# Patient Record
Sex: Female | Born: 1965
Health system: Southern US, Community
[De-identification: ages and names within clinical notes are randomized; demographics above are authoritative.]

## PROBLEM LIST (undated history)

## (undated) DIAGNOSIS — D219 Benign neoplasm of connective and other soft tissue, unspecified: Secondary | ICD-10-CM

## (undated) DIAGNOSIS — R519 Headache, unspecified: Secondary | ICD-10-CM

## (undated) DIAGNOSIS — R51 Headache: Secondary | ICD-10-CM

## (undated) DIAGNOSIS — I10 Essential (primary) hypertension: Secondary | ICD-10-CM

## (undated) DIAGNOSIS — E559 Vitamin D deficiency, unspecified: Secondary | ICD-10-CM

## (undated) DIAGNOSIS — D649 Anemia, unspecified: Secondary | ICD-10-CM

## (undated) HISTORY — PX: UTERINE FIBROID EMBOLIZATION: SHX825

## (undated) HISTORY — PX: BREAST SURGERY: SHX581

---

## 2012-07-30 ENCOUNTER — Encounter (HOSPITAL_COMMUNITY): Payer: Self-pay | Admitting: Emergency Medicine

## 2012-07-30 ENCOUNTER — Emergency Department (HOSPITAL_COMMUNITY)
Admission: EM | Admit: 2012-07-30 | Discharge: 2012-07-30 | Disposition: A | Discharge status: Home / Self Care | Payer: No Typology Code available for payment source | Attending: Emergency Medicine | Admitting: Emergency Medicine

## 2012-07-30 ENCOUNTER — Emergency Department (HOSPITAL_COMMUNITY): Payer: No Typology Code available for payment source

## 2012-07-30 DIAGNOSIS — N939 Abnormal uterine and vaginal bleeding, unspecified: Secondary | ICD-10-CM

## 2012-07-30 DIAGNOSIS — D649 Anemia, unspecified: Secondary | ICD-10-CM

## 2012-07-30 DIAGNOSIS — N898 Other specified noninflammatory disorders of vagina: Secondary | ICD-10-CM | POA: Insufficient documentation

## 2012-07-30 DIAGNOSIS — Z79899 Other long term (current) drug therapy: Secondary | ICD-10-CM | POA: Insufficient documentation

## 2012-07-30 DIAGNOSIS — Z3202 Encounter for pregnancy test, result negative: Secondary | ICD-10-CM | POA: Insufficient documentation

## 2012-07-30 DIAGNOSIS — D259 Leiomyoma of uterus, unspecified: Secondary | ICD-10-CM

## 2012-07-30 DIAGNOSIS — R109 Unspecified abdominal pain: Secondary | ICD-10-CM | POA: Insufficient documentation

## 2012-07-30 LAB — URINALYSIS, ROUTINE W REFLEX MICROSCOPIC
Bilirubin Urine: NEGATIVE
Glucose, UA: NEGATIVE mg/dL
Ketones, ur: NEGATIVE mg/dL
Leukocytes, UA: NEGATIVE
Nitrite: NEGATIVE
Protein, ur: NEGATIVE mg/dL
Specific Gravity, Urine: 1.012 (ref 1.005–1.030)
Urobilinogen, UA: 0.2 mg/dL (ref 0.0–1.0)
pH: 5.5 (ref 5.0–8.0)

## 2012-07-30 LAB — CBC WITH DIFFERENTIAL/PLATELET
Basophils Relative: 0 % (ref 0–1)
Eosinophils Absolute: 0.2 10*3/uL (ref 0.0–0.7)
Eosinophils Relative: 3 % (ref 0–5)
Hemoglobin: 7.9 g/dL — ABNORMAL LOW (ref 12.0–15.0)
Lymphs Abs: 1.7 10*3/uL (ref 0.7–4.0)
MCH: 21.8 pg — ABNORMAL LOW (ref 26.0–34.0)
MCHC: 29.6 g/dL — ABNORMAL LOW (ref 30.0–36.0)
MCV: 73.8 fL — ABNORMAL LOW (ref 78.0–100.0)
Monocytes Absolute: 0.3 10*3/uL (ref 0.1–1.0)
Platelets: 213 10*3/uL (ref 150–400)
RBC: 3.62 MIL/uL — ABNORMAL LOW (ref 3.87–5.11)

## 2012-07-30 LAB — URINE MICROSCOPIC-ADD ON

## 2012-07-30 LAB — BASIC METABOLIC PANEL
BUN: 6 mg/dL (ref 6–23)
CO2: 26 mEq/L (ref 19–32)
Chloride: 104 mEq/L (ref 96–112)
Creatinine, Ser: 0.58 mg/dL (ref 0.50–1.10)

## 2012-07-30 LAB — WET PREP, GENITAL

## 2012-07-30 MED ORDER — FERROUS SULFATE 325 (65 FE) MG PO TABS
325.0000 mg | ORAL_TABLET | Freq: Once | ORAL | Status: AC
  Administered 2012-07-30: 325 mg via ORAL
  Filled 2012-07-30: qty 1

## 2012-07-30 MED ORDER — TRAMADOL HCL 50 MG PO TABS: 50.0000 mg | ORAL_TABLET | Freq: Four times a day (QID) | ORAL | Status: DC | PRN

## 2012-07-30 MED ORDER — FERROUS SULFATE 325 (65 FE) MG PO TABS: 325.0000 mg | ORAL_TABLET | Freq: Every day | ORAL | Status: AC

## 2012-07-30 NOTE — ED Notes (Signed)
Pt reports vaginal bleeding x 2 weeks now.  Normally only has her period x 1 week.. Pt also reports low abd discomfort.  Pt reports the discomfort gets better after having a BM.  Pt reports BM is not regular, LBM-x 2 days ago.  Pt reports right now bleeding is very mild, is spotting like she is getting ready to start her period again.

## 2012-07-30 NOTE — ED Provider Notes (Signed)
History    CSN: [id number] Arrival date & time 07/30/12  1126  First MD Initiated Contact with Patient 07/30/12 1210     Chief Complaint  Patient presents with  . Abdominal Pain  . Vaginal Bleeding  . Cyst   (Consider location/radiation/quality/duration/timing/severity/associated sxs/prior Treatment) HPI Comments: Patient is a 47 year old female who presents with a 2 week history of abdominal pain. The pain is located in her lower abdomen and does not radiate. The pain is described as cramping and moderate. The pain started gradually and progressively worsened since the onset. No alleviating/aggravating factors. The patient has tried nothing for symptoms without relief. Associated symptoms include vaginal bleeding for 2 weeks. Patient was supposed to stop her menstrual cycle 1 week ago but has continued to bleed. She reports changing her pad every 2 hours. Patient denies fever, headache, NVD, chest pain, SOB, dysuria, constipation.     History reviewed. No pertinent past medical history. History reviewed. No pertinent past surgical history. No family history on file. History  Substance Use Topics  . Smoking status: Not on file  . Smokeless tobacco: Not on file  . Alcohol Use: Not on file   OB History   Grav Para Term Preterm Abortions TAB SAB Ect Mult Living                 Review of Systems  Gastrointestinal: Positive for abdominal pain.  Genitourinary: Positive for vaginal bleeding and menstrual problem.  All other systems reviewed and are negative.    Allergies  Review of patient's allergies indicates no known allergies.  Home Medications   Current Outpatient Rx  Name  Route  Sig  Dispense  Refill  . Aspirin-Salicylamide-Caffeine (BC HEADACHE POWDER PO)   Oral   Take 1 Package by mouth every 6 (six) hours as needed (pain).         . ferrous sulfate 325 (65 FE) MG EC tablet   Oral   Take 325 mg by mouth 2 (two) times daily with breakfast and lunch.           BP 142/100  Pulse 72  Temp(Src) 97.6 F (36.4 C) (Oral)  Resp 20  SpO2 100% Physical Exam  Nursing note and vitals reviewed. Constitutional: She is oriented to person, place, and time. She appears well-developed and well-nourished. No distress.  HENT:  Head: Normocephalic and atraumatic.  Eyes: Conjunctivae are normal.  Neck: Normal range of motion.  Cardiovascular: Normal rate and regular rhythm.  Exam reveals no gallop and no friction rub.   No murmur heard. Pulmonary/Chest: Effort normal and breath sounds normal. She has no wheezes. She has no rales. She exhibits no tenderness.  Abdominal: Soft. She exhibits no distension. There is tenderness. There is no rebound and no guarding.  Mild suprapubic abdominal tenderness to palpation. No peritoneal signs or focal tenderness to palpation.   Genitourinary:  External genitalia normal. Moderate amount of blood noted in vagina coming from cervical os. No CMT or discharge noted. Patient reports discomfort with palpation of uterus on bimanual exam. No abnormal masses palpated.   Musculoskeletal: Normal range of motion.  Neurological: She is alert and oriented to person, place, and time. Coordination normal.  Speech is goal-oriented. Moves limbs without ataxia.   Skin: Skin is warm and dry.  Psychiatric: She has a normal mood and affect. Her behavior is normal.    ED Course  Procedures (including critical care time) Labs Reviewed  WET PREP, GENITAL - Abnormal; Notable for  the following:    Clue Cells Wet Prep HPF POC MODERATE (*)    WBC, Wet Prep HPF POC MODERATE (*)    All other components within normal limits  URINALYSIS, ROUTINE W REFLEX MICROSCOPIC - Abnormal; Notable for the following:    APPearance CLOUDY (*)    Hgb urine dipstick MODERATE (*)    All other components within normal limits  CBC WITH DIFFERENTIAL - Abnormal; Notable for the following:    RBC 3.62 (*)    Hemoglobin 7.9 (*)    HCT 26.7 (*)    MCV 73.8 (*)     MCH 21.8 (*)    MCHC 29.6 (*)    RDW 17.5 (*)    All other components within normal limits  GC/CHLAMYDIA PROBE AMP  BASIC METABOLIC PANEL  PREGNANCY, URINE  URINE MICROSCOPIC-ADD ON   US Transvaginal Non-ob  07/30/2012   *RADIOLOGY REPORT*  Clinical Data: Abdominal pain and vaginal bleeding.  Evaluate for cyst.  LMP 07/15/2012  TRANSABDOMINAL AND TRANSVAGINAL ULTRASOUND OF PELVIS Technique:  Both transabdominal and transvaginal ultrasound examinations of the pelvis were performed. Transabdominal technique was performed for global imaging of the pelvis including uterus, ovaries, adnexal regions, and pelvic cul-de-sac.  It was necessary to proceed with endovaginal exam following the transabdominal exam to visualize the myometrium, endometrium and adnexa.  Comparison:  None  Findings:  Uterus: Is anteverted and anteflexed and demonstrates a sagittal length of 14.4 cm, depth of 9.0 cm and width of 10.6 cm.  A focal fibroid is identified in the anterior fundal region measuring 6.5 x 5.7 x 7.4 cm and a smaller fibroid is seen in the anterior lower uterine segment measuring 3.5 x 3.1 x 2.7 cm.  Endometrium: Cannot be visualized due to the presence of the fibroid load.  The central positioning of the largest fibroid makes a partial submucosal component possible.  Right ovary:  Is only seen transabdominally and has a normal transabdominal appearance measuring 2.6 x 1.6 x 2.5 cm  Left ovary: Has a normal appearance measuring 3.5 x 2.7 x 3.6 cm containing several follicles  Other findings: No pelvic fluid or separate adnexal masses are seen.  IMPRESSION: Enlarged uterus with fibroids as noted above.  Central positioning of the largest fibroid makes a partial submucosal component possible.  The endometrial lining is incompletely evaluated due to the fibroid load. If further assessment of the endometrial fibroid relationship is desired, sonohysterography or pelvic MRI can be performed.  Normal ovaries.   Original  Report Authenticated By: Rhodia Albright, M.D.   US Pelvis Complete  07/30/2012   *RADIOLOGY REPORT*  Clinical Data: Abdominal pain and vaginal bleeding.  Evaluate for cyst.  LMP 07/15/2012  TRANSABDOMINAL AND TRANSVAGINAL ULTRASOUND OF PELVIS Technique:  Both transabdominal and transvaginal ultrasound examinations of the pelvis were performed. Transabdominal technique was performed for global imaging of the pelvis including uterus, ovaries, adnexal regions, and pelvic cul-de-sac.  It was necessary to proceed with endovaginal exam following the transabdominal exam to visualize the myometrium, endometrium and adnexa.  Comparison:  None  Findings:  Uterus: Is anteverted and anteflexed and demonstrates a sagittal length of 14.4 cm, depth of 9.0 cm and width of 10.6 cm.  A focal fibroid is identified in the anterior fundal region measuring 6.5 x 5.7 x 7.4 cm and a smaller fibroid is seen in the anterior lower uterine segment measuring 3.5 x 3.1 x 2.7 cm.  Endometrium: Cannot be visualized due to the presence of the fibroid load.  The  central positioning of the largest fibroid makes a partial submucosal component possible.  Right ovary:  Is only seen transabdominally and has a normal transabdominal appearance measuring 2.6 x 1.6 x 2.5 cm  Left ovary: Has a normal appearance measuring 3.5 x 2.7 x 3.6 cm containing several follicles  Other findings: No pelvic fluid or separate adnexal masses are seen.  IMPRESSION: Enlarged uterus with fibroids as noted above.  Central positioning of the largest fibroid makes a partial submucosal component possible.  The endometrial lining is incompletely evaluated due to the fibroid load. If further assessment of the endometrial fibroid relationship is desired, sonohysterography or pelvic MRI can be performed.  Normal ovaries.   Original Report Authenticated By: Rhodia Albright, M.D.   1. Uterine fibroid   2. Vaginal bleeding   3. Anemia     MDM  12:41 PM Labs and urinalysis  pending. Pelvic exam done. Patient will have pelvic US. Vitals stable and patient afebrile.   2:41 PM Labs show low hemoglobin. Patient will have iron supplement. Patient's US shows uterine fibroids likely causing the patient's bleeding. Patient will be discharged with iron supplements and instructed to follow up with her OBGYN in [place]. Vitals stable and patient afebrile. Patient denies SOB, lightheadedness. Patient instructed to return with worsening or concerning symptoms.   Emilia Beck, PA-C 07/30/12 1448

## 2012-07-30 NOTE — ED Notes (Signed)
Pt ambulated to the BR with steady gait.   

## 2012-07-30 NOTE — ED Notes (Signed)
Pt states that she has been on her cycle for 2 weeks now heavy, states that she has lower abd pain and cramping, normally her cycle is on for 7 days, she feels a knot to rt lower abd pain .

## 2012-07-31 LAB — GC/CHLAMYDIA PROBE AMP
CT Probe RNA: NEGATIVE
GC Probe RNA: NEGATIVE

## 2012-08-07 NOTE — ED Provider Notes (Signed)
Medical screening examination/treatment/procedure(s) were performed by non-physician practitioner and as supervising physician I was immediately available for consultation/collaboration.  Tashica Provencio T Della Homan, MD 08/07/12 0527 

## 2012-08-14 ENCOUNTER — Encounter (HOSPITAL_COMMUNITY): Payer: Self-pay | Admitting: Emergency Medicine

## 2012-08-14 ENCOUNTER — Emergency Department (HOSPITAL_COMMUNITY)
Admission: EM | Admit: 2012-08-14 | Discharge: 2012-08-14 | Discharge status: Against Medical Advice | Payer: No Typology Code available for payment source | Attending: Emergency Medicine | Admitting: Emergency Medicine

## 2012-08-14 DIAGNOSIS — Z87891 Personal history of nicotine dependence: Secondary | ICD-10-CM | POA: Insufficient documentation

## 2012-08-14 DIAGNOSIS — N63 Unspecified lump in unspecified breast: Secondary | ICD-10-CM | POA: Insufficient documentation

## 2012-08-14 DIAGNOSIS — D259 Leiomyoma of uterus, unspecified: Secondary | ICD-10-CM | POA: Insufficient documentation

## 2012-08-14 NOTE — ED Notes (Addendum)
Pt reports having uterine fibroids, that she has obtained treatment before, however pain since diagnosed has continued and increased. Pt also c/o a tender area in her breast that she is concerned about. Pt states she has been having hot flashes over the past week. Pt states her period came on 07/17/2012 and went off for one week during the week of 08/08/2012, however restarted yesterday.  Pt is A/O x4, NAD.

## 2012-11-02 ENCOUNTER — Emergency Department (HOSPITAL_BASED_OUTPATIENT_CLINIC_OR_DEPARTMENT_OTHER)
Admission: EM | Admit: 2012-11-02 | Discharge: 2012-11-02 | Disposition: A | Discharge status: Home / Self Care | Payer: Medicaid Other | Attending: Emergency Medicine | Admitting: Emergency Medicine

## 2012-11-02 ENCOUNTER — Encounter (HOSPITAL_BASED_OUTPATIENT_CLINIC_OR_DEPARTMENT_OTHER): Payer: Self-pay | Admitting: Emergency Medicine

## 2012-11-02 DIAGNOSIS — R079 Chest pain, unspecified: Secondary | ICD-10-CM | POA: Insufficient documentation

## 2012-11-02 DIAGNOSIS — T887XXA Unspecified adverse effect of drug or medicament, initial encounter: Secondary | ICD-10-CM

## 2012-11-02 DIAGNOSIS — R42 Dizziness and giddiness: Secondary | ICD-10-CM | POA: Insufficient documentation

## 2012-11-02 DIAGNOSIS — R61 Generalized hyperhidrosis: Secondary | ICD-10-CM | POA: Insufficient documentation

## 2012-11-02 DIAGNOSIS — Z79899 Other long term (current) drug therapy: Secondary | ICD-10-CM | POA: Insufficient documentation

## 2012-11-02 DIAGNOSIS — T454X5A Adverse effect of iron and its compounds, initial encounter: Secondary | ICD-10-CM | POA: Insufficient documentation

## 2012-11-02 DIAGNOSIS — D649 Anemia, unspecified: Secondary | ICD-10-CM | POA: Insufficient documentation

## 2012-11-02 DIAGNOSIS — Z87891 Personal history of nicotine dependence: Secondary | ICD-10-CM | POA: Insufficient documentation

## 2012-11-02 DIAGNOSIS — R0602 Shortness of breath: Secondary | ICD-10-CM | POA: Insufficient documentation

## 2012-11-02 HISTORY — DX: Anemia, unspecified: D64.9

## 2012-11-02 LAB — CBC WITH DIFFERENTIAL/PLATELET
Basophils Absolute: 0 10*3/uL (ref 0.0–0.1)
HCT: 23.6 % — ABNORMAL LOW (ref 36.0–46.0)
Lymphs Abs: 2.1 10*3/uL (ref 0.7–4.0)
MCH: 21.2 pg — ABNORMAL LOW (ref 26.0–34.0)
MCHC: 28.8 g/dL — ABNORMAL LOW (ref 30.0–36.0)
MCV: 73.5 fL — ABNORMAL LOW (ref 78.0–100.0)
Monocytes Relative: 10 % (ref 3–12)
Neutro Abs: 6.4 10*3/uL (ref 1.7–7.7)
RDW: 18.6 % — ABNORMAL HIGH (ref 11.5–15.5)

## 2012-11-02 LAB — COMPREHENSIVE METABOLIC PANEL
Alkaline Phosphatase: 42 U/L (ref 39–117)
BUN: 9 mg/dL (ref 6–23)
GFR calc Af Amer: >90 mL/min (ref >90)
Glucose, Bld: 113 mg/dL — ABNORMAL HIGH (ref 70–99)
Potassium: 3.3 mEq/L — ABNORMAL LOW (ref 3.5–5.1)
Total Bilirubin: <0.1 mg/dL — ABNORMAL LOW (ref 0.3–1.2)
Total Protein: 6.7 g/dL (ref 6.0–8.3)

## 2012-11-02 LAB — TROPONIN I: Troponin I: <0.3 ng/mL (ref <0.30)

## 2012-11-02 NOTE — ED Notes (Signed)
Pt having left sided chest pain.  Pt states it started this am, with some sob, dizziness, lightheadedness and diaphoresis.  Pt states she received iron infusion yesterday for hemoglobin of 7.

## 2012-11-02 NOTE — ED Provider Notes (Signed)
CSN: [id number]     Arrival date & time 11/02/12  4098 History   First MD Initiated Contact with Patient 11/02/12 [phone]     Chief Complaint  Patient presents with  . Chest Pain    HPI Pt having left sided chest pain. Pt states it started this am, with some sob, dizziness, lightheadedness and diaphoresis. Pt states she received iron infusion yesterday for hemoglobin of 7.   Past Medical History  Diagnosis Date  . Anemia    Past Surgical History  Procedure Laterality Date  . Cesarean section     History reviewed. No pertinent family history. History  Substance Use Topics  . Smoking status: Former Smoker -- 0.20 packs/day for 2 years    Types: Cigars    Quit date: 04/12/2012  . Smokeless tobacco: Never Used  . Alcohol Use: No   OB History   Grav Para Term Preterm Abortions TAB SAB Ect Mult Living                 Review of Systems All other systems reviewed and are negative Allergies  Review of patient's allergies indicates no known allergies.  Home Medications   Current Outpatient Rx  Name  Route  Sig  Dispense  Refill  . ferrous sulfate 325 (65 FE) MG tablet   Oral   Take 1 tablet (325 mg total) by mouth daily.   30 tablet   0    BP 150/80  Pulse 62  Temp(Src) 98.4 F (36.9 C) (Oral)  Resp 24  Ht 5\' 6"  (1.676 m)  Wt 150 lb (68.04 kg)  BMI 24.22 kg/m2  SpO2 100%  LMP 10/19/2012 Physical Exam  Nursing note and vitals reviewed. Constitutional: She is oriented to person, place, and time. She appears well-developed and well-nourished. No distress.  HENT:  Head: Normocephalic and atraumatic.  Eyes: Pupils are equal, round, and reactive to light.  Neck: Normal range of motion.  Cardiovascular: Normal rate and intact distal pulses.   Pulmonary/Chest: No respiratory distress.  Abdominal: Normal appearance. She exhibits no distension.  Musculoskeletal: Normal range of motion.  Neurological: She is alert and oriented to person, place, and time. No cranial nerve  deficit.  Skin: Skin is warm and dry. No rash noted.  Psychiatric: She has a normal mood and affect. Her behavior is normal.    ED Course  Procedures (including critical care time) Labs Review Labs Reviewed  CBC WITH DIFFERENTIAL - Abnormal; Notable for the following:    RBC 3.21 (*)    Hemoglobin 6.8 (*)    HCT 23.6 (*)    MCV 73.5 (*)    MCH 21.2 (*)    MCHC 28.8 (*)    RDW 18.6 (*)    All other components within normal limits  COMPREHENSIVE METABOLIC PANEL - Abnormal; Notable for the following:    Potassium 3.3 (*)    Glucose, Bld 113 (*)    Albumin 3.4 (*)    Total Bilirubin <0.1 (*)    All other components within normal limits  TROPONIN I  TROPONIN I   Imaging Review No results found.  EKG Interpretation     Ventricular Rate:  63 PR Interval:  158 QRS Duration: 84 QT Interval:  408 QTC Calculation: 417 R Axis:   23 Text Interpretation:  Normal sinus rhythm Normal ECG           Review of iron infusion reveals chest pain is a common side effect.  A lot of normal  EKG and serial troponins which are negative most likely at etiology of patient's discomfort.  Hemoglobin appears to be stable.  Patient discharged home with followup instructions. MDM   1. Non-dose-related adverse reaction to medication, initial encounter        Nelia Shi, MD 11/02/12 1252

## 2012-11-13 ENCOUNTER — Emergency Department (HOSPITAL_BASED_OUTPATIENT_CLINIC_OR_DEPARTMENT_OTHER)
Admission: EM | Admit: 2012-11-13 | Discharge: 2012-11-13 | Discharge status: Against Medical Advice | Payer: Medicaid Other | Attending: Emergency Medicine | Admitting: Emergency Medicine

## 2012-11-13 ENCOUNTER — Encounter (HOSPITAL_BASED_OUTPATIENT_CLINIC_OR_DEPARTMENT_OTHER): Payer: Self-pay | Admitting: Emergency Medicine

## 2012-11-13 DIAGNOSIS — D649 Anemia, unspecified: Secondary | ICD-10-CM | POA: Insufficient documentation

## 2012-11-13 DIAGNOSIS — Z87891 Personal history of nicotine dependence: Secondary | ICD-10-CM | POA: Insufficient documentation

## 2012-11-13 DIAGNOSIS — R079 Chest pain, unspecified: Secondary | ICD-10-CM | POA: Insufficient documentation

## 2012-11-13 DIAGNOSIS — N390 Urinary tract infection, site not specified: Secondary | ICD-10-CM | POA: Insufficient documentation

## 2012-11-13 DIAGNOSIS — Z79899 Other long term (current) drug therapy: Secondary | ICD-10-CM | POA: Insufficient documentation

## 2012-11-13 LAB — URINALYSIS, ROUTINE W REFLEX MICROSCOPIC
Hgb urine dipstick: NEGATIVE
Ketones, ur: 15 mg/dL — AB
Nitrite: POSITIVE — AB
Specific Gravity, Urine: 1.018 (ref 1.005–1.030)
Urobilinogen, UA: >8 mg/dL — ABNORMAL HIGH (ref 0.0–1.0)
pH: 7 (ref 5.0–8.0)

## 2012-11-13 LAB — URINE MICROSCOPIC-ADD ON

## 2012-11-13 LAB — TROPONIN I: Troponin I: <0.3 ng/mL (ref <0.30)

## 2012-11-13 MED ORDER — SULFAMETHOXAZOLE-TRIMETHOPRIM 800-160 MG PO TABS: 1.0000 | ORAL_TABLET | Freq: Two times a day (BID) | ORAL | Status: DC

## 2012-11-13 MED ORDER — SULFAMETHOXAZOLE-TMP DS 800-160 MG PO TABS
1.0000 | ORAL_TABLET | Freq: Once | ORAL | Status: AC
  Administered 2012-11-13: 1 via ORAL
  Filled 2012-11-13: qty 1

## 2012-11-13 NOTE — ED Notes (Signed)
Patient changed into gown, MD at bedside.

## 2012-11-13 NOTE — ED Notes (Signed)
C/o CP and SOB x 2 weeks-states same c/o after having iron infusion 2 weeks ago-states she was seen for same and advised is normal side effect-concerned that s/s have not gone away

## 2012-11-13 NOTE — ED Notes (Signed)
MD at bedside giving test results and dispo plan of care. 

## 2012-11-13 NOTE — ED Provider Notes (Signed)
CSN: [id number]     Arrival date & time 11/13/12  1357 History   First MD Initiated Contact with Patient 11/13/12 1408     Chief Complaint  Patient presents with  . Chest Pain   (Consider location/radiation/quality/duration/timing/severity/associated sxs/prior Treatment) Patient is a 47 y.o. female presenting with chest pain. The history is provided by the patient.  Chest Pain Pain location:  R chest and L chest Pain quality: pressure   Pain radiates to:  Does not radiate Pain radiates to the back: no   Pain severity:  Mild Onset quality:  Sudden Timing:  Sporadic Progression:  Improving Chronicity:  New Context: not eating   Relieved by:  Nothing Worsened by:  Nothing tried Ineffective treatments:  None tried Associated symptoms: no abdominal pain, no cough, no fatigue, no fever, no shortness of breath and not vomiting     Past Medical History  Diagnosis Date  . Anemia    Past Surgical History  Procedure Laterality Date  . Cesarean section     No family history on file. History  Substance Use Topics  . Smoking status: Former Smoker -- 0.20 packs/day for 2 years    Types: Cigars    Quit date: 04/12/2012  . Smokeless tobacco: Never Used  . Alcohol Use: No   OB History   Grav Para Term Preterm Abortions TAB SAB Ect Mult Living                 Review of Systems  Constitutional: Negative for fever, chills and fatigue.  Respiratory: Negative for cough and shortness of breath.   Cardiovascular: Positive for chest pain. Negative for leg swelling.  Gastrointestinal: Negative for vomiting and abdominal pain.  All other systems reviewed and are negative.    Allergies  Review of patient's allergies indicates no known allergies.  Home Medications   Current Outpatient Rx  Name  Route  Sig  Dispense  Refill  . ferrous sulfate 325 (65 FE) MG tablet   Oral   Take 1 tablet (325 mg total) by mouth daily.   30 tablet   0   . sulfamethoxazole-trimethoprim (SEPTRA DS)  800-160 MG per tablet   Oral   Take 1 tablet by mouth 2 (two) times daily.   10 tablet   0    BP 134/83  Pulse 81  Temp(Src) 99.1 F (37.3 C) (Oral)  Resp 18  Ht 5\' 5"  (1.651 m)  Wt 145 lb (65.772 kg)  BMI 24.13 kg/m2  SpO2 100%  LMP 10/19/2012 Physical Exam  Nursing note and vitals reviewed. Constitutional: She is oriented to person, place, and time. She appears well-developed and well-nourished. No distress.  HENT:  Head: Normocephalic and atraumatic.  Eyes: EOM are normal. Pupils are equal, round, and reactive to light.  Neck: Normal range of motion. Neck supple.  Cardiovascular: Normal rate and regular rhythm.  Exam reveals no friction rub.   No murmur heard. Pulmonary/Chest: Effort normal and breath sounds normal. No respiratory distress. She has no wheezes. She has no rales. She exhibits no tenderness.  Abdominal: Soft. She exhibits no distension. There is no tenderness. There is no rebound.  Musculoskeletal: Normal range of motion. She exhibits no edema.  Neurological: She is alert and oriented to person, place, and time.  Skin: She is not diaphoretic.    ED Course  Procedures (including critical care time) Labs Review Labs Reviewed  URINALYSIS, ROUTINE W REFLEX MICROSCOPIC - Abnormal; Notable for the following:    Color, Urine  RED (*)    APPearance CLOUDY (*)    Bilirubin Urine SMALL (*)    Ketones, ur 15 (*)    Protein, ur 30 (*)    Urobilinogen, UA >8.0 (*)    Nitrite POSITIVE (*)    Leukocytes, UA LARGE (*)    All other components within normal limits  URINE MICROSCOPIC-ADD ON - Abnormal; Notable for the following:    Bacteria, UA MANY (*)    All other components within normal limits  URINE CULTURE  TROPONIN I   Imaging Review No results found.  EKG Interpretation     Ventricular Rate:  79 PR Interval:  146 QRS Duration: 74 QT Interval:  370 QTC Calculation: 424 R Axis:   16 Text Interpretation:  Normal sinus rhythm Nonspecific ST  abnormality            MDM   1. Chest pain   2. UTI (urinary tract infection)    45F here with chest pain. It has been going on for the past 2 weeks. She's had a recent iron infusion and the day after having chest pain. She was seen in the ED that day had serial troponins that were negative and a normal EKG. Minor infusion causes chest pain as part of the side effects. She's had continuing chest pain, however is improving, however is still happening usually once a day or once every other day. She denies any fevers, shortness of breath, vomiting, diarrhea, nausea here. She states the pain is like a pressure that she cannot find any alleviating or exacerbating factors or. No instigating factors. Here EKG is normal. We will check serial troponins. We'll also check a urinalysis as she states some pressure on her bladder, however denies dysuria. PERC negative. UA shows UTI. Bactrim given PO. She wants to sign out AMA prior to her 2nd troponin returning. I think this is reasonable, however I do want to pursue serial troponins due to intermittent nature of CP. Patient given Bactrim for UTI.  Dagmar Hait, MD 11/13/12 1536

## 2012-11-14 LAB — URINE CULTURE

## 2012-11-15 NOTE — ED Notes (Signed)
+   Urine Patient treated with Sulfa-trim.sensitive to same per protocol MD.

## 2013-01-16 HISTORY — PX: DILATION AND CURETTAGE OF UTERUS: SHX78

## 2013-04-24 HISTORY — PX: ABLATION: SHX5711

## 2013-06-18 ENCOUNTER — Emergency Department (HOSPITAL_BASED_OUTPATIENT_CLINIC_OR_DEPARTMENT_OTHER)
Admission: EM | Admit: 2013-06-18 | Discharge: 2013-06-18 | Disposition: A | Discharge status: Home / Self Care | Payer: Medicaid Other | Attending: Emergency Medicine | Admitting: Emergency Medicine

## 2013-06-18 ENCOUNTER — Emergency Department (HOSPITAL_BASED_OUTPATIENT_CLINIC_OR_DEPARTMENT_OTHER): Payer: Medicaid Other

## 2013-06-18 ENCOUNTER — Encounter (HOSPITAL_BASED_OUTPATIENT_CLINIC_OR_DEPARTMENT_OTHER): Payer: Self-pay | Admitting: Emergency Medicine

## 2013-06-18 DIAGNOSIS — D251 Intramural leiomyoma of uterus: Secondary | ICD-10-CM | POA: Insufficient documentation

## 2013-06-18 DIAGNOSIS — D649 Anemia, unspecified: Secondary | ICD-10-CM | POA: Insufficient documentation

## 2013-06-18 DIAGNOSIS — Z79899 Other long term (current) drug therapy: Secondary | ICD-10-CM | POA: Insufficient documentation

## 2013-06-18 DIAGNOSIS — R102 Pelvic and perineal pain: Secondary | ICD-10-CM

## 2013-06-18 DIAGNOSIS — Z87891 Personal history of nicotine dependence: Secondary | ICD-10-CM | POA: Insufficient documentation

## 2013-06-18 DIAGNOSIS — D219 Benign neoplasm of connective and other soft tissue, unspecified: Secondary | ICD-10-CM

## 2013-06-18 DIAGNOSIS — Z9889 Other specified postprocedural states: Secondary | ICD-10-CM | POA: Insufficient documentation

## 2013-06-18 DIAGNOSIS — Z792 Long term (current) use of antibiotics: Secondary | ICD-10-CM | POA: Insufficient documentation

## 2013-06-18 DIAGNOSIS — Z3202 Encounter for pregnancy test, result negative: Secondary | ICD-10-CM | POA: Insufficient documentation

## 2013-06-18 DIAGNOSIS — N898 Other specified noninflammatory disorders of vagina: Secondary | ICD-10-CM

## 2013-06-18 DIAGNOSIS — Z791 Long term (current) use of non-steroidal anti-inflammatories (NSAID): Secondary | ICD-10-CM | POA: Insufficient documentation

## 2013-06-18 HISTORY — DX: Benign neoplasm of connective and other soft tissue, unspecified: D21.9

## 2013-06-18 LAB — URINALYSIS, ROUTINE W REFLEX MICROSCOPIC
Bilirubin Urine: NEGATIVE
Glucose, UA: NEGATIVE mg/dL
Ketones, ur: NEGATIVE mg/dL
Nitrite: NEGATIVE
PH: 5.5 (ref 5.0–8.0)
Protein, ur: NEGATIVE mg/dL
SPECIFIC GRAVITY, URINE: 1.029 (ref 1.005–1.030)
Urobilinogen, UA: 1 mg/dL (ref 0.0–1.0)

## 2013-06-18 LAB — URINE MICROSCOPIC-ADD ON

## 2013-06-18 LAB — PREGNANCY, URINE: PREG TEST UR: NEGATIVE

## 2013-06-18 LAB — WET PREP, GENITAL
TRICH WET PREP: NONE SEEN
YEAST WET PREP: NONE SEEN

## 2013-06-18 LAB — GC/CHLAMYDIA PROBE AMP
CT Probe RNA: NEGATIVE
GC Probe RNA: NEGATIVE

## 2013-06-18 MED ORDER — LIDOCAINE HCL (PF) 1 % IJ SOLN
INTRAMUSCULAR | Status: AC
  Administered 2013-06-18: 2 mL
  Filled 2013-06-18: qty 5

## 2013-06-18 MED ORDER — DOXYCYCLINE HYCLATE 100 MG PO CAPS: 100.0000 mg | ORAL_CAPSULE | Freq: Two times a day (BID) | ORAL | Status: DC

## 2013-06-18 MED ORDER — OXYCODONE-ACETAMINOPHEN 5-325 MG PO TABS: 1.0000 | ORAL_TABLET | Freq: Four times a day (QID) | ORAL | Status: DC | PRN

## 2013-06-18 MED ORDER — CEFTRIAXONE SODIUM 250 MG IJ SOLR
250.0000 mg | Freq: Once | INTRAMUSCULAR | Status: AC
  Administered 2013-06-18: 250 mg via INTRAMUSCULAR
  Filled 2013-06-18: qty 250

## 2013-06-18 MED ORDER — KETOROLAC TROMETHAMINE 60 MG/2ML IM SOLN
60.0000 mg | Freq: Once | INTRAMUSCULAR | Status: AC
  Administered 2013-06-18: 60 mg via INTRAMUSCULAR
  Filled 2013-06-18: qty 2

## 2013-06-18 MED ORDER — AZITHROMYCIN 1 G PO PACK
1.0000 g | PACK | Freq: Once | ORAL | Status: AC
  Administered 2013-06-18: 1 g via ORAL
  Filled 2013-06-18: qty 1

## 2013-06-18 NOTE — ED Provider Notes (Signed)
CSN: [id number]     Arrival date & time 06/18/13  0559 History   None    Chief Complaint  Patient presents with  . Abdominal Pain     (Consider location/radiation/quality/duration/timing/severity/associated sxs/prior Treatment) Patient is a 48 y.o. female presenting with abdominal pain. The history is provided by the patient.  Abdominal Pain Pain location:  Suprapubic Pain radiates to:  R flank Pain severity:  Severe Onset quality:  Gradual Timing:  Intermittent Progression:  Worsening Chronicity:  New Context: not trauma   Relieved by:  Nothing Worsened by:  Nothing tried Ineffective treatments:  None tried Associated symptoms: constipation, vaginal bleeding and vaginal discharge   Associated symptoms: no anorexia, no chills, no diarrhea, no dysuria, no fever, no hematuria, no nausea and no vomiting   Vaginal discharge:    Quality:  Yellow   Severity:  Moderate   Onset quality:  Gradual   Timing:  Constant   Progression:  Unchanged   Chronicity:  New Risk factors: not pregnant     Past Medical History  Diagnosis Date  . Anemia   . Fibroid    Past Surgical History  Procedure Laterality Date  . Cesarean section     History reviewed. No pertinent family history. History  Substance Use Topics  . Smoking status: Former Smoker -- 0.20 packs/day for 2 years    Types: Cigars    Quit date: 04/12/2012  . Smokeless tobacco: Never Used  . Alcohol Use: No   OB History   Grav Para Term Preterm Abortions TAB SAB Ect Mult Living                 Review of Systems  Constitutional: Negative for fever, chills and appetite change.  Gastrointestinal: Positive for abdominal pain and constipation. Negative for nausea, vomiting, diarrhea and anorexia.  Genitourinary: Positive for vaginal bleeding and vaginal discharge. Negative for dysuria and hematuria.  All other systems reviewed and are negative.     Allergies  Review of patient's allergies indicates no known  allergies.  Home Medications   Prior to Admission medications   Medication Sig Start Date End Date Taking? Authorizing Provider  ibuprofen (ADVIL,MOTRIN) 200 MG tablet Take 200 mg by mouth every 6 (six) hours as needed.   Yes Historical Provider, MD  ferrous sulfate 325 (65 FE) MG tablet Take 1 tablet (325 mg total) by mouth daily. 07/30/12   Kaitlyn Szekalski, PA-C  sulfamethoxazole-trimethoprim (SEPTRA DS) 800-160 MG per tablet Take 1 tablet by mouth 2 (two) times daily. 11/13/12   Osvaldo Shipper, MD   BP 164/96  Pulse 67  Temp(Src) 98.4 F (36.9 C) (Oral)  Resp 16  Ht 5\' 3"  (1.6 m)  Wt 140 lb (63.504 kg)  BMI 24.81 kg/m2  SpO2 100% Physical Exam  Constitutional: She is oriented to person, place, and time. She appears well-developed and well-nourished. No distress.  HENT:  Head: Normocephalic and atraumatic.  Mouth/Throat: Oropharynx is clear and moist.  Eyes: Conjunctivae are normal. Pupils are equal, round, and reactive to light.  Neck: Normal range of motion. Neck supple.  Cardiovascular: Normal rate, regular rhythm and intact distal pulses.   Pulmonary/Chest: Effort normal and breath sounds normal. She has no wheezes. She has no rales.  Abdominal: Soft. Bowel sounds are increased. There is tenderness. There is no rebound, no guarding, no tenderness at McBurney's point and negative Murphy's sign.    Tenderness is mild  Genitourinary: Vaginal discharge found.  Chaperone present.  Copious yellow-green discharge thin  consistency,  Enlarged lobular uterus present.  Mild adnexal tenderness on the righ  Musculoskeletal: Normal range of motion.  Neurological: She is alert and oriented to person, place, and time.  Skin: Skin is warm and dry.  Psychiatric: She has a normal mood and affect.    ED Course  Procedures (including critical care time) Labs Review Labs Reviewed  WET PREP, GENITAL  GC/CHLAMYDIA PROBE AMP  URINALYSIS, ROUTINE W REFLEX MICROSCOPIC  PREGNANCY,  URINE    Imaging Review No results found.   EKG Interpretation None      MDM   Final diagnoses:  None   Patient does not clinically have appendicitis and there are no signs of appendicitis on CT scan.    Discharge is consistent with pelvic infection.  Will treat for same.  Suspect pain is related to infection and fibroids.  Gave patient the option of waiting to discuss treatment with her regular doctor.  Patient is concerned about the ongoing discharge and using pads daily and elects to be treated at this time. This is reasonable given clinical exam.  Will treat with Rocephin, azithromycin and doxycycline as an outpatient along with a course of pain medication.   Cultures for GC and chlamydia were sent.  Discussed with patient need for outpatient ultrasound to better characterize fibroids.  Patient states she will follow up with her gynecologist for ongoing care and evaluation.      Carlisle Beers, MD 06/18/13 [phone]

## 2013-06-18 NOTE — ED Notes (Addendum)
Pt states history of fibroids. Pt states right lower quadrant pain for 3 days. Pt states that her lmp was a week ago. Pt states some discharge as well yellow and sometimes green. Pt states that she has had 2 ablations of her uterus to prevent heavy bleeding during her period. Pt denies nausea, vomiting, diarrhea or fever.

## 2013-11-12 ENCOUNTER — Ambulatory Visit: Payer: No Typology Code available for payment source

## 2013-11-19 ENCOUNTER — Other Ambulatory Visit: Payer: Self-pay | Admitting: Specialist

## 2013-11-19 DIAGNOSIS — D259 Leiomyoma of uterus, unspecified: Secondary | ICD-10-CM

## 2013-12-02 ENCOUNTER — Other Ambulatory Visit (HOSPITAL_COMMUNITY): Payer: Self-pay | Admitting: Interventional Radiology

## 2013-12-02 ENCOUNTER — Ambulatory Visit
Admission: RE | Admit: 2013-12-02 | Discharge: 2013-12-02 | Disposition: A | Discharge status: Home / Self Care | Payer: Medicaid Other | Source: Ambulatory Visit | Attending: Specialist | Admitting: Specialist

## 2013-12-02 DIAGNOSIS — D259 Leiomyoma of uterus, unspecified: Secondary | ICD-10-CM

## 2013-12-02 HISTORY — DX: Headache: R51

## 2013-12-02 HISTORY — DX: Headache, unspecified: R51.9

## 2013-12-02 HISTORY — DX: Vitamin D deficiency, unspecified: E55.9

## 2013-12-02 NOTE — Consult Note (Signed)
Chief Complaint:  abnormal uterine bleeding  Referring Physician(s): Dorn,Henry H  History of Present Illness: Sara Travis is a 48 y.o. female referred for consultation regarding her abnormal uterine bleeding. She is diagnosed with uterine fibroids since July 2014. She's having heavy uterine bleeding which is sporadic, such that it is difficult to determine the exact frequency of uterine menstrual cycle due to significant interperiod bleeding. She had D&C in January 16 2013, and bleeding continued. She had thermal ablation of the endometrial lining April 24 2013. She notes only 1 week without any bleeding since April 2015. Her bleeding when heavy requires changing pads every 1-2 hours. The bleeding has palpated by anemia, requiring 2 iron infusions and are supplementation. While her fibroids are palpable, she complains of no significant bulk symptoms, urinary frequency, urgency, or back pain.  Pap smear 10/22/2012 negative. Endometrial biopsy 01/20/2013 unremarkable. She is G4, 2 C-sections. No plans for future pregnancy. No definite perimenopausal symptoms.  Past Medical History  Diagnosis Date  . Fibroid   . Headache     migraines  . Vitamin D deficiency   . Anemia     iron deficiency anemia    Past Surgical History  Procedure Laterality Date  . Cesarean section      x 2  . Ablation  04/24/13    thermal ablation of endometrium (Thermochoice Balloon Ablation)  . Breast surgery Right     lumpectomy  . Dilation and curettage of uterus  01/16/13    Allergies: Review of patient's allergies indicates no known allergies.  Medications: Prior to Admission medications   Medication Sig Start Date End Date Taking? Authorizing Provider  ferrous sulfate 325 (65 FE) MG tablet Take 1 tablet (325 mg total) by mouth daily. 07/30/12  Yes Kaitlyn Szekalski, PA-C  doxycycline (VIBRAMYCIN) 100 MG capsule Take 1 capsule (100 mg total) by mouth 2 (two) times daily. One po bid x 14  days Patient not taking: Reported on 12/02/2013 06/18/13   April K Palumbo-Rasch, MD  ibuprofen (ADVIL,MOTRIN) 200 MG tablet Take 200 mg by mouth every 6 (six) hours as needed.    Historical Provider, MD  oxyCODONE-acetaminophen (PERCOCET) 5-325 MG per tablet Take 1 tablet by mouth every 6 (six) hours as needed. Patient not taking: Reported on 12/02/2013 06/18/13   April K Palumbo-Rasch, MD  sulfamethoxazole-trimethoprim [facility]) 800-160 MG per tablet Take 1 tablet by mouth 2 (two) times daily. Patient not taking: Reported on 12/02/2013 11/13/12   Evelina Bucy, MD  Vitamin D, Ergocalciferol, (DRISDOL) 50000 UNITS CAPS capsule Take 50,000 Units by mouth every 7 (seven) days.    Historical Provider, MD    No family history on file.  History   Social History  . Marital Status: Single    Spouse Name: N/A    Number of Children: N/A  . Years of Education: N/A   Social History Main Topics  . Smoking status: Former Smoker -- 0.20 packs/day for 2 years    Types: Cigars    Quit date: 04/12/2012  . Smokeless tobacco: Never Used  . Alcohol Use: Yes     Comment: occasionally drinks wine  . Drug Use: No  . Sexual Activity: Not on file   Other Topics Concern  . Not on file   Social History Narrative    ECOG Status: 1 - Symptomatic but completely ambulatory  Review of Systems: A 12 point ROS discussed and pertinent positives are indicated in the HPI above.  All other systems are negative.  Review of Systems  Genitourinary: Positive for vaginal bleeding and vaginal discharge.  All other systems reviewed and are negative.   Vital Signs: BP 145/87 mmHg  Pulse 59  Temp(Src) 98.3 F (36.8 C)  Resp 16  Ht 5\' 4"  (1.626 m)  Wt 135 lb (61.236 kg)  BMI 23.16 kg/m2  SpO2 100%  LMP 11/17/2013 (Approximate)  Physical Exam  Constitutional: She is oriented to person, place, and time. She appears well-developed and well-nourished.  Non-toxic appearance. No distress. She is not intubated.   HENT:  Head: Normocephalic and atraumatic.  Nose: No rhinorrhea. No epistaxis.  Eyes: EOM are normal. Right eye exhibits no discharge. Left eye exhibits no discharge. No scleral icterus.  Neck: Normal range of motion and phonation normal.  Pulmonary/Chest: Effort normal. No accessory muscle usage or stridor. No tachypnea. She is not intubated. No respiratory distress.  Abdominal: Soft. Normal appearance. She exhibits no distension. There is no tenderness.  Musculoskeletal: Normal range of motion.  Neurological: She is alert and oriented to person, place, and time. Gait normal.  Skin: Skin is warm and dry. She is not diaphoretic.  Psychiatric: She has a normal mood and affect. Her behavior is normal. Judgment and thought content normal. Her affect is not angry and not inappropriate. Her speech is not rapid and/or pressured and not slurred. She is not agitated, not aggressive and not combative. Cognition and memory are not impaired. She is communicative.    Imaging: TRANSABDOMINAL AND TRANSVAGINAL ULTRASOUND OF PELVIS Technique: Both transabdominal and transvaginal ultrasound examinations of the pelvis were performed. Transabdominal technique was performed for global imaging of the pelvis including uterus, ovaries, adnexal regions, and pelvic cul-de-sac.  It was necessary to proceed with endovaginal exam following the transabdominal exam to visualize the myometrium, endometrium and adnexa.  Comparison: None  Findings:  Uterus: Is anteverted and anteflexed and demonstrates a sagittal length of 14.4 cm, depth of 9.0 cm and width of 10.6 cm. A focal fibroid is identified in the anterior fundal region measuring 6.5 x 5.7 x 7.4 cm and a smaller fibroid is seen in the anterior lower uterine segment measuring 3.5 x 3.1 x 2.7 cm.  Endometrium: Cannot be visualized due to the presence of the fibroid load. The central positioning of the largest fibroid makes a partial submucosal  component possible.  Right ovary: Is only seen transabdominally and has a normal transabdominal appearance measuring 2.6 x 1.6 x 2.5 cm  Left ovary: Has a normal appearance measuring 3.5 x 2.7 x 3.6 cm containing several follicles  Other findings: No pelvic fluid or separate adnexal masses are seen.  IMPRESSION: Enlarged uterus with fibroids as noted above. Central positioning of the largest fibroid makes a partial submucosal component possible. The endometrial lining is incompletely evaluated due to the fibroid load. If further assessment of the endometrial fibroid relationship is desired, sonohysterography or pelvic MRI can be performed.  Normal ovaries.   Original Report Authenticated By: Ponciano Ort, M.D.   CT ABDOMEN AND PELVIS WITHOUT CONTRAST  TECHNIQUE: Multidetector CT imaging of the abdomen and pelvis was performed following the standard protocol without IV contrast.  COMPARISON: Pelvic ultrasound July 30, 2012.  FINDINGS: LUNG BASES: Included view of the lung bases are clear. The heart appears mildly enlarged, pericardium is unremarkable.  KIDNEYS/BLADDER: Kidneys are orthotopic, demonstrating normal size and morphology. No nephrolithiasis, hydronephrosis; limited assessment for renal masses on this nonenhanced examination. The unopacified ureters are normal in course and caliber. Urinary bladder is decompressed and unremarkable.  SOLID ORGANS: The spleen, pancreas and adrenal glands are unremarkable for this non-contrast examination. 2.6 cm cyst in left lobe of the liver, the liver is otherwise unremarkable. Gallbladder is not visualized, likely surgically absent. Apparent dropped clip in the right pelvis.  GASTROINTESTINAL TRACT: The stomach, small and large bowel are normal in course and caliber without inflammatory changes, the sensitivity may be decreased by lack of enteric contrast. Moderate amount of retained large bowel  stool. The appendix is not discretely identified, however there are no inflammatory changes in the right lower quadrant.  PERITONEUM/RETROPERITONEUM: No intraperitoneal free fluid nor free air. Aortoiliac vessels are normal in course and caliber. No lymphadenopathy by CT size criteria. Enlarged heterogeneous uterus, as seen on prior imaging.  SOFT TISSUES/ OSSEOUS STRUCTURES: The soft tissues and included osseous structures are nonsuspicious.  IMPRESSION: No urolithiasis nor acute intra-abdominal or pelvic process. Moderate amount of retained large bowel stool.  Enlarged uterus, prior pelvic sonogram reported multiple leiomyomata.   Electronically Signed  By: Elon Alas  On: 06/18/2013 06:52 Labs:  CBC: No results for input(s): WBC, HGB, HCT, PLT in the last 8760 hours.  COAGS: No results for input(s): INR, APTT in the last 8760 hours.  BMP: No results for input(s): NA, K, CL, CO2, GLUCOSE, BUN, CALCIUM, CREATININE, GFRNONAA, GFRAA in the last 8760 hours.  Invalid input(s): CMP  LIVER FUNCTION TESTS: No results for input(s): BILITOT, AST, ALT, ALKPHOS, PROT, ALBUMIN in the last 8760 hours.  TUMOR MARKERS: No results for input(s): AFPTM, CEA, CA199, CHROMGRNA in the last 8760 hours.  Assessment and Plan: My impression is that this patient's significant metromenorrhagia is probably secondary at least in part to her significant uterine fibroid load. She would be an appropriate candidate for consideration of uterine fibroid embolization. To complete her workup, I would require an MR pelvis with contrast to best assess the location of her uterine fibroids, specifically to exclude any subserosal or submucosal fibroids on a narrow pedicle, as well as to exclude any unexpected pelvic pathology. We discussed options including watchful waiting, hysterectomy, and myomectomy. We discussed the natural history of uterine leiomyomata. We discussed the expected time course of  symptom resolution post UFE. We discussed the technique, anticipated benefits, possible risks side effects and complications. She seemed to understand. She asked appropriate questions, which were answered. She was motivated to proceed. Therefore, we'll go ahead and schedule the MR pelvis with contrast and, pending these results, we can set up UFE electively at [facility], with overnight admission for pain control.   Thank you for this interesting consult.  I greatly enjoyed meeting Sara Travis and look forward to participating in her care.     I spent a total of 45 minutes face to face in clinical consultation, greater than 50% of which was counseling/coordinating care for abnormal uterine bleeding.  Signed: Whalen Trompeter III,DAYNE Bennette Hasty 12/02/2013, 2:42 PM

## 2013-12-09 ENCOUNTER — Ambulatory Visit (HOSPITAL_COMMUNITY)
Admission: RE | Admit: 2013-12-09 | Discharge: 2013-12-09 | Disposition: A | Discharge status: Home / Self Care | Payer: Medicaid Other | Source: Ambulatory Visit | Attending: Interventional Radiology | Admitting: Interventional Radiology

## 2013-12-09 ENCOUNTER — Emergency Department (HOSPITAL_BASED_OUTPATIENT_CLINIC_OR_DEPARTMENT_OTHER)
Admission: EM | Admit: 2013-12-09 | Discharge: 2013-12-09 | Disposition: A | Discharge status: Home / Self Care | Payer: Medicaid Other | Attending: Emergency Medicine | Admitting: Emergency Medicine

## 2013-12-09 ENCOUNTER — Encounter: Payer: Self-pay | Admitting: Radiology

## 2013-12-09 ENCOUNTER — Encounter (HOSPITAL_BASED_OUTPATIENT_CLINIC_OR_DEPARTMENT_OTHER): Payer: Self-pay

## 2013-12-09 DIAGNOSIS — Z8639 Personal history of other endocrine, nutritional and metabolic disease: Secondary | ICD-10-CM | POA: Diagnosis not present

## 2013-12-09 DIAGNOSIS — D259 Leiomyoma of uterus, unspecified: Secondary | ICD-10-CM | POA: Diagnosis not present

## 2013-12-09 DIAGNOSIS — Z79899 Other long term (current) drug therapy: Secondary | ICD-10-CM | POA: Diagnosis not present

## 2013-12-09 DIAGNOSIS — Z3202 Encounter for pregnancy test, result negative: Secondary | ICD-10-CM | POA: Insufficient documentation

## 2013-12-09 DIAGNOSIS — D649 Anemia, unspecified: Secondary | ICD-10-CM | POA: Diagnosis not present

## 2013-12-09 DIAGNOSIS — Z87891 Personal history of nicotine dependence: Secondary | ICD-10-CM | POA: Insufficient documentation

## 2013-12-09 DIAGNOSIS — N3001 Acute cystitis with hematuria: Secondary | ICD-10-CM | POA: Diagnosis not present

## 2013-12-09 DIAGNOSIS — R109 Unspecified abdominal pain: Secondary | ICD-10-CM | POA: Diagnosis present

## 2013-12-09 LAB — URINALYSIS, ROUTINE W REFLEX MICROSCOPIC
Glucose, UA: NEGATIVE mg/dL
Ketones, ur: 40 mg/dL — AB
NITRITE: POSITIVE — AB
PH: 5 (ref 5.0–8.0)
Protein, ur: 100 mg/dL — AB
Specific Gravity, Urine: 1.029 (ref 1.005–1.030)

## 2013-12-09 LAB — URINE MICROSCOPIC-ADD ON

## 2013-12-09 LAB — PREGNANCY, URINE: PREG TEST UR: NEGATIVE

## 2013-12-09 MED ORDER — PHENAZOPYRIDINE HCL 100 MG PO TABS
200.0000 mg | ORAL_TABLET | Freq: Once | ORAL | Status: AC
  Administered 2013-12-09: 200 mg via ORAL
  Filled 2013-12-09: qty 2

## 2013-12-09 MED ORDER — PHENAZOPYRIDINE HCL 200 MG PO TABS: 200.0000 mg | ORAL_TABLET | Freq: Three times a day (TID) | ORAL | Status: DC

## 2013-12-09 MED ORDER — CIPROFLOXACIN HCL 500 MG PO TABS
500.0000 mg | ORAL_TABLET | Freq: Once | ORAL | Status: AC
  Administered 2013-12-09: 500 mg via ORAL
  Filled 2013-12-09: qty 1

## 2013-12-09 MED ORDER — GADOBENATE DIMEGLUMINE 529 MG/ML IV SOLN
12.0000 mL | Freq: Once | INTRAVENOUS | Status: AC | PRN
  Administered 2013-12-09: 12 mL via INTRAVENOUS

## 2013-12-09 MED ORDER — CIPROFLOXACIN HCL 500 MG PO TABS: 500.0000 mg | ORAL_TABLET | Freq: Two times a day (BID) | ORAL | Status: DC

## 2013-12-09 NOTE — ED Notes (Signed)
Pt c/o RLQ pain, hx of fibroids, states having burning of urination with frequency; states has a appt tonight for a MRI for fibroids

## 2013-12-09 NOTE — ED Provider Notes (Addendum)
CSN: [id number]     Arrival date & time 12/09/13  0601 History   None    Chief Complaint  Patient presents with  . Abdominal Pain     (Consider location/radiation/quality/duration/timing/severity/associated sxs/prior Treatment) HPI  This is a 48 year old female with known uterine fibroids. She has recently had an ultrasound which demonstrated multiple fibroids and is scheduled for an MRI today. The results of the MRI will be used to guide treatment, specifically hysterectomy versus embolization.  She is here this morning with right suprapubic pain that began yesterday afternoon about 2 PM. It is associated with urinary urgency, urinary frequency and burning with urination. Pain is tolerable when standing but when sitting or lying it becomes severe. She has had transient relief with over-the-counter phenazopyridine. She has a chronic vaginal discharge that has not acutely changed. She has no vaginal bleeding presently. She denies nausea, vomiting, diarrhea, fever or chills.  Past Medical History  Diagnosis Date  . Fibroid   . Headache     migraines  . Vitamin D deficiency   . Anemia     iron deficiency anemia   Past Surgical History  Procedure Laterality Date  . Cesarean section      x 2  . Ablation  04/24/13    thermal ablation of endometrium (Thermochoice Balloon Ablation)  . Breast surgery Right     lumpectomy  . Dilation and curettage of uterus  01/16/13   No family history on file. History  Substance Use Topics  . Smoking status: Former Smoker -- 0.20 packs/day for 2 years    Types: Cigars    Quit date: 04/12/2012  . Smokeless tobacco: Never Used  . Alcohol Use: Yes     Comment: occasionally drinks wine   OB History    No data available     Review of Systems  All other systems reviewed and are negative.   Allergies  Review of patient's allergies indicates no known allergies.  Home Medications   Prior to Admission medications   Medication Sig Start Date End  Date Taking? Authorizing Provider  ferrous sulfate 325 (65 FE) MG tablet Take 1 tablet (325 mg total) by mouth daily. 07/30/12   Kaitlyn Szekalski, PA-C  ibuprofen (ADVIL,MOTRIN) 200 MG tablet Take 200 mg by mouth every 6 (six) hours as needed.    Historical Provider, MD   BP 144/89 mmHg  Pulse 74  Temp(Src) 98.8 F (37.1 C) (Oral)  Resp 20  Ht 5\' 5"  (1.651 m)  Wt 130 lb (58.968 kg)  BMI 21.63 kg/m2  SpO2 99%  LMP 11/17/2013 (Approximate)   Physical Exam  General: Well-developed, well-nourished female in no acute distress; appearance consistent with age of record HENT: normocephalic; atraumatic Eyes: pupils equal, round and reactive to light; extraocular muscles intact Neck: supple Heart: regular rate and rhythm Lungs: clear to auscultation bilaterally Abdomen: soft; nondistended; right suprapubic tenderness; irregular suprapubic mass or hepatosplenomegaly; bowel sounds present GU: Normal external genitalia; no vaginal bleeding; enlarged, irregular, tender uterus Extremities: No deformity; full range of motion; pulses normal Neurologic: Awake, alert and oriented; motor function intact in all extremities and symmetric; no facial droop Skin: Warm and dry Psychiatric: Normal mood and affect    ED Course  Procedures (including critical care time)   MDM   Nursing notes and vitals signs, including pulse oximetry, reviewed.  Summary of this visit's results, reviewed by myself:  Labs:  Results for orders placed or performed during the hospital encounter of 12/09/13 (from the past  24 hour(s))  Pregnancy, urine     Status: None   Collection Time: 12/09/13  6:10 AM  Result Value Ref Range   Preg Test, Ur NEGATIVE NEGATIVE  Urinalysis, Routine w reflex microscopic     Status: Abnormal   Collection Time: 12/09/13  6:10 AM  Result Value Ref Range   Color, Urine RED (A) YELLOW   APPearance CLOUDY (A) CLEAR   Specific Gravity, Urine 1.029 1.005 - 1.030   pH 5.0 5.0 - 8.0    Glucose, UA NEGATIVE NEGATIVE mg/dL   Hgb urine dipstick SMALL (A) NEGATIVE   Bilirubin Urine MODERATE (A) NEGATIVE   Ketones, ur 40 (A) NEGATIVE mg/dL   Protein, ur 100 (A) NEGATIVE mg/dL   Urobilinogen, UA >8.0 (H) 0.0 - 1.0 mg/dL   Nitrite POSITIVE (A) NEGATIVE   Leukocytes, UA LARGE (A) NEGATIVE  Urine microscopic-add on     Status: Abnormal   Collection Time: 12/09/13  6:10 AM  Result Value Ref Range   Squamous Epithelial / LPF MANY (A) RARE   WBC, UA TOO NUMEROUS TO COUNT <3 WBC/hpf   RBC / HPF 21-50 <3 RBC/hpf   Bacteria, UA MANY (A) RARE   Will treat for a complicated UTI as this is likely secondary partial bladder outlet obstruction by her fibroids.     Wynetta Fines, MD 12/09/13 [id number]  Wynetta Fines, MD 12/09/13 [phone]

## 2013-12-10 ENCOUNTER — Telehealth: Payer: Self-pay | Admitting: Radiology

## 2013-12-10 ENCOUNTER — Other Ambulatory Visit: Payer: Self-pay | Admitting: Interventional Radiology

## 2013-12-10 DIAGNOSIS — D251 Intramural leiomyoma of uterus: Secondary | ICD-10-CM

## 2013-12-10 LAB — URINE CULTURE

## 2013-12-10 NOTE — Telephone Encounter (Signed)
Left msg requesting patient call re:  MR results of 12/09/13; ? If ready to schedule [place].  Shonte Soderlund Riki Rusk, RN 12/10/2013 10:40 AM

## 2013-12-15 ENCOUNTER — Telehealth: Payer: Self-pay | Admitting: Emergency Medicine

## 2013-12-15 NOTE — Telephone Encounter (Signed)
RETURNED CALL TO PT. THIS MORNING, DR HASSELL HAS REVIEWED HER MRI AND SHE IS A GOOD CANDIDATE FOR [place] TO BE PERFORMED AT [facility] WILL HAVE THEM CONTACT PT. DIRECTLY

## 2014-01-07 ENCOUNTER — Other Ambulatory Visit: Payer: Self-pay | Admitting: Interventional Radiology

## 2014-01-07 DIAGNOSIS — D259 Leiomyoma of uterus, unspecified: Secondary | ICD-10-CM

## 2014-01-22 ENCOUNTER — Other Ambulatory Visit: Payer: Medicaid Other

## 2014-01-28 ENCOUNTER — Telehealth: Payer: Self-pay | Admitting: Emergency Medicine

## 2014-02-04 ENCOUNTER — Ambulatory Visit
Admission: RE | Admit: 2014-02-04 | Discharge: 2014-02-04 | Disposition: A | Discharge status: Home / Self Care | Payer: Medicaid Other | Source: Ambulatory Visit | Attending: Interventional Radiology | Admitting: Interventional Radiology

## 2014-02-04 DIAGNOSIS — D259 Leiomyoma of uterus, unspecified: Secondary | ICD-10-CM

## 2014-02-04 HISTORY — PX: IR GENERIC HISTORICAL: IMG1180011

## 2014-02-04 NOTE — Progress Notes (Signed)
Chief Complaint: Chief Complaint  Patient presents with  . Follow-up    1 mo follow up [place]      Referring Physician(s): Dr Jac Canavan   History of Present Illness:  Sara Travis is a 49 y.o. female status post bilateral Uterine Artery Embolization and superior hypogastric nerve block for her symptomatic uterine fibroids.  Her procedure was performed at [facility] on Tuesday, 01/06/2014.  She was discharged on POD 1, the morning after her treatment.  She is now 4 weeks post-op.  I had spoken to [name] on Monday or Tuesday on the week following her procedure, approximately 1 week afterwards.  She had complaints of on-going bleeding without abdominal pain, and without foul odor. She had fatigue and body aches, but no fevers, rigors, or chills.  I assured her that these symptoms were most likely related to the expected spectrum of post-embolization syndrome, and that she should improve.  She was satisfied at the time with the plan of care.   Today, [name] confirmed that she improved shortly after her phone call during that first week.  She reports that her bleeding stopped approximately 2 weeks ago, and has not recurred.  She reports that she has had a thin, watery discharge, but that this was present before her embolization procedure.  She denies any foul odor or purulence.  She says she is back to her usual work schedule, and is not limited in her daily activities.  She denies any problem with our right hip access site.   Today I had a detailed discussion about expectations for the next 6 months, preceding her follow up appointment.  We typically expect unpredictable bleeding/cycles for the first 1-3 months, which should improve.  She may even experience tissue passage.  All of this can be considered normal.  We would be concerned about any new abdominal pain, foul discharge, purulent discharge, or fevers, rigors, or chills.  She understands.    As a specific complaint, Ms  Huard does notice that a dark discoloration on her upper lip is present, which was present before her procedure.  I did mention that perhaps a dermatologist would have better input, and that her primary doctor may be able to set up a referral.       Past Medical History  Diagnosis Date  . Fibroid   . Headache     migraines  . Vitamin D deficiency   . Anemia     iron deficiency anemia    Past Surgical History  Procedure Laterality Date  . Cesarean section      x 2  . Ablation  04/24/13    thermal ablation of endometrium (Thermochoice Balloon Ablation)  . Breast surgery Right     lumpectomy  . Dilation and curettage of uterus  01/16/13    Allergies: Review of patient's allergies indicates no known allergies.  Medications: Prior to Admission medications   Medication Sig Start Date End Date Taking? Authorizing Provider  ferrous sulfate 325 (65 FE) MG tablet Take 1 tablet (325 mg total) by mouth daily. 07/30/12  Yes Kaitlyn Szekalski, PA-C  phenazopyridine (PYRIDIUM) 200 MG tablet Take 1 tablet (200 mg total) by mouth 3 (three) times daily. 12/09/13  Yes John L Molpus, MD  ciprofloxacin (CIPRO) 500 MG tablet Take 1 tablet (500 mg total) by mouth 2 (two) times daily. One po bid x 7 days Patient not taking: Reported on 02/04/2014 12/09/13   Wynetta Fines, MD  ibuprofen (ADVIL,MOTRIN)  200 MG tablet Take 200 mg by mouth every 6 (six) hours as needed.    Historical Provider, MD    No family history on file.  History   Social History  . Marital Status: Single    Spouse Name: N/A    Number of Children: N/A  . Years of Education: N/A   Social History Main Topics  . Smoking status: Former Smoker -- 0.20 packs/day for 2 years    Types: Cigars    Quit date: 04/12/2012  . Smokeless tobacco: Never Used  . Alcohol Use: Yes     Comment: occasionally drinks wine  . Drug Use: No  . Sexual Activity: Not on file   Other Topics Concern  . Not on file   Social History Narrative     ECOG Status: 0 - Asymptomatic  Review of Systems: A 12 point ROS discussed and pertinent positives are indicated in the HPI above.  All other systems are negative.  Review of Systems  Vital Signs: BP 151/90 mmHg  Pulse 66  Temp(Src) 98.3 F (36.8 C) (Oral)  Resp 14  SpO2 98%  LMP 01/06/2014  Physical Exam  Imaging: No results found.  Labs:  CBC: No results for input(s): WBC, HGB, HCT, PLT in the last 8760 hours.  COAGS: No results for input(s): INR, APTT in the last 8760 hours.  BMP: No results for input(s): NA, K, CL, CO2, GLUCOSE, BUN, CALCIUM, CREATININE, GFRNONAA, GFRAA in the last 8760 hours.  Invalid input(s): CMP  LIVER FUNCTION TESTS: No results for input(s): BILITOT, AST, ALT, ALKPHOS, PROT, ALBUMIN in the last 8760 hours.  TUMOR MARKERS: No results for input(s): AFPTM, CEA, CA199, CHROMGRNA in the last 8760 hours.  Assessment and Plan:  [name] is a 49 yo female, 4 weeks status post bilateral uterine artery ablation for symptomatic uterine fibroids.  She is doing well, having returned to her daily activities with no problems.  She reports an occasional thin, watery discharge without foul odor, which was present before her embo.   This is felt to be related to her fibroids, and may improve over the next few months.    A full discussion about the expectations for the next few months was held, and she is satisfied with her current plan of care.  She understands that if she experiences any new abdominal pain, particularly if associated with new malodorous discharge, fever, rigors, or chills, she should seek help immediately.    We will see Ms Mcelvain in approximately 6 months as a scheduled follow up.   SignedCorrie Mckusick 02/04/2014, 10:03 AM   I spent a total of 40 minutes face to face in clinical consultation, greater than 50% of which was counseling/coordinating care for her treated symptomatic uterine fibroids, SP bilateral [place] and superior  hypogastric nerve block.   Signed,  Dulcy Fanny. Earleen Newport, DO

## 2014-03-20 ENCOUNTER — Telehealth: Payer: Self-pay | Admitting: *Deleted

## 2014-03-20 NOTE — Telephone Encounter (Signed)
lmom to see how she is doing 58mth post [place]

## 2014-05-16 ENCOUNTER — Emergency Department (HOSPITAL_BASED_OUTPATIENT_CLINIC_OR_DEPARTMENT_OTHER)
Admission: EM | Admit: 2014-05-16 | Discharge: 2014-05-16 | Disposition: A | Discharge status: Home / Self Care | Payer: Medicaid Other | Attending: Emergency Medicine | Admitting: Emergency Medicine

## 2014-05-16 ENCOUNTER — Encounter (HOSPITAL_BASED_OUTPATIENT_CLINIC_OR_DEPARTMENT_OTHER): Payer: Self-pay | Admitting: Emergency Medicine

## 2014-05-16 DIAGNOSIS — D649 Anemia, unspecified: Secondary | ICD-10-CM | POA: Insufficient documentation

## 2014-05-16 DIAGNOSIS — Z86018 Personal history of other benign neoplasm: Secondary | ICD-10-CM | POA: Insufficient documentation

## 2014-05-16 DIAGNOSIS — N390 Urinary tract infection, site not specified: Secondary | ICD-10-CM | POA: Diagnosis not present

## 2014-05-16 DIAGNOSIS — Z792 Long term (current) use of antibiotics: Secondary | ICD-10-CM | POA: Diagnosis not present

## 2014-05-16 DIAGNOSIS — Z79899 Other long term (current) drug therapy: Secondary | ICD-10-CM | POA: Diagnosis not present

## 2014-05-16 DIAGNOSIS — Z87891 Personal history of nicotine dependence: Secondary | ICD-10-CM | POA: Insufficient documentation

## 2014-05-16 DIAGNOSIS — Z3202 Encounter for pregnancy test, result negative: Secondary | ICD-10-CM | POA: Insufficient documentation

## 2014-05-16 DIAGNOSIS — Z8639 Personal history of other endocrine, nutritional and metabolic disease: Secondary | ICD-10-CM | POA: Insufficient documentation

## 2014-05-16 DIAGNOSIS — R350 Frequency of micturition: Secondary | ICD-10-CM | POA: Diagnosis present

## 2014-05-16 LAB — URINALYSIS, ROUTINE W REFLEX MICROSCOPIC
Glucose, UA: NEGATIVE mg/dL
Ketones, ur: 15 mg/dL — AB
Nitrite: POSITIVE — AB
PH: 5 (ref 5.0–8.0)
Protein, ur: 30 mg/dL — AB
SPECIFIC GRAVITY, URINE: 1.016 (ref 1.005–1.030)
UROBILINOGEN UA: 4 mg/dL — AB (ref 0.0–1.0)

## 2014-05-16 LAB — URINE MICROSCOPIC-ADD ON

## 2014-05-16 LAB — PREGNANCY, URINE: PREG TEST UR: NEGATIVE

## 2014-05-16 MED ORDER — PHENAZOPYRIDINE HCL 200 MG PO TABS: 200.0000 mg | ORAL_TABLET | Freq: Three times a day (TID) | ORAL | Status: DC

## 2014-05-16 MED ORDER — CIPROFLOXACIN HCL 500 MG PO TABS: 500.0000 mg | ORAL_TABLET | Freq: Two times a day (BID) | ORAL | Status: DC

## 2014-05-16 NOTE — Discharge Instructions (Signed)
Cipro as prescribed.  Pyridium as prescribed for burning.  Return to the ER if your symptoms significantly worsen or change.   Urinary Tract Infection Urinary tract infections (UTIs) can develop anywhere along your urinary tract. Your urinary tract is your body's drainage system for removing wastes and extra water. Your urinary tract includes two kidneys, two ureters, a bladder, and a urethra. Your kidneys are a pair of bean-shaped organs. Each kidney is about the size of your fist. They are located below your ribs, one on each side of your spine. CAUSES Infections are caused by microbes, which are microscopic organisms, including fungi, viruses, and bacteria. These organisms are so small that they can only be seen through a microscope. Bacteria are the microbes that most commonly cause UTIs. SYMPTOMS  Symptoms of UTIs may vary by age and gender of the patient and by the location of the infection. Symptoms in young women typically include a frequent and intense urge to urinate and a painful, burning feeling in the bladder or urethra during urination. Older women and men are more likely to be tired, shaky, and weak and have muscle aches and abdominal pain. A fever may mean the infection is in your kidneys. Other symptoms of a kidney infection include pain in your back or sides below the ribs, nausea, and vomiting. DIAGNOSIS To diagnose a UTI, your caregiver will ask you about your symptoms. Your caregiver also will ask to provide a urine sample. The urine sample will be tested for bacteria and white blood cells. White blood cells are made by your body to help fight infection. TREATMENT  Typically, UTIs can be treated with medication. Because most UTIs are caused by a bacterial infection, they usually can be treated with the use of antibiotics. The choice of antibiotic and length of treatment depend on your symptoms and the type of bacteria causing your infection. HOME CARE INSTRUCTIONS  If you were  prescribed antibiotics, take them exactly as your caregiver instructs you. Finish the medication even if you feel better after you have only taken some of the medication.  Drink enough water and fluids to keep your urine clear or pale yellow.  Avoid caffeine, tea, and carbonated beverages. They tend to irritate your bladder.  Empty your bladder often. Avoid holding urine for long periods of time.  Empty your bladder before and after sexual intercourse.  After a bowel movement, women should cleanse from front to back. Use each tissue only once. SEEK MEDICAL CARE IF:   You have back pain.  You develop a fever.  Your symptoms do not begin to resolve within 3 days. SEEK IMMEDIATE MEDICAL CARE IF:   You have severe back pain or lower abdominal pain.  You develop chills.  You have nausea or vomiting.  You have continued burning or discomfort with urination. MAKE SURE YOU:   Understand these instructions.  Will watch your condition.  Will get help right away if you are not doing well or get worse. Document Released: 10/05/2004 Document Revised: 06/27/2011 Document Reviewed: 02/03/2011 [facility] Patient Information 2015 [place], [place]. This information is not intended to replace advice given to you by your health care provider. Make sure you discuss any questions you have with your health care provider.

## 2014-05-16 NOTE — ED Notes (Signed)
Patient states that she woke up yesterday morning and started to have pressure in her bladder. Attempted OTC meds.

## 2014-05-16 NOTE — ED Provider Notes (Signed)
CSN: [id number]     Arrival date & time 05/16/14  0443 History   First MD Initiated Contact with Patient 05/16/14 0507     Chief Complaint  Patient presents with  . Urinary Frequency     (Consider location/radiation/quality/duration/timing/severity/associated sxs/prior Treatment) HPI Comments: Patient is a 49 year old female with history of uterine fibroids. She presents for evaluation of lower abdominal discomfort and urinary frequency which started yesterday. She feels as though she likely has a bladder infection. She denies any fevers or chills. She denies any blood in the urine. She denies any bowel issues.  Patient is a 49 y.o. female presenting with frequency. The history is provided by the patient.  Urinary Frequency This is a new problem. The current episode started yesterday. The problem occurs constantly. The problem has been gradually worsening. Pertinent negatives include no chest pain and no abdominal pain. Exacerbated by: Urinating. Nothing relieves the symptoms. She has tried nothing for the symptoms.    Past Medical History  Diagnosis Date  . Fibroid   . Headache     migraines  . Vitamin D deficiency   . Anemia     iron deficiency anemia   Past Surgical History  Procedure Laterality Date  . Cesarean section      x 2  . Ablation  04/24/13    thermal ablation of endometrium (Thermochoice Balloon Ablation)  . Breast surgery Right     lumpectomy  . Dilation and curettage of uterus  01/16/13   History reviewed. No pertinent family history. History  Substance Use Topics  . Smoking status: Former Smoker -- 0.20 packs/day for 2 years    Types: Cigars    Quit date: 04/12/2012  . Smokeless tobacco: Never Used  . Alcohol Use: Yes     Comment: occasionally drinks wine   OB History    No data available     Review of Systems  Cardiovascular: Negative for chest pain.  Gastrointestinal: Negative for abdominal pain.  Genitourinary: Positive for frequency.  All other  systems reviewed and are negative.     Allergies  Review of patient's allergies indicates no known allergies.  Home Medications   Prior to Admission medications   Medication Sig Start Date End Date Taking? Authorizing Provider  ciprofloxacin (CIPRO) 500 MG tablet Take 1 tablet (500 mg total) by mouth 2 (two) times daily. One po bid x 7 days Patient not taking: Reported on 02/04/2014 12/09/13   Shanon Rosser, MD  ferrous sulfate 325 (65 FE) MG tablet Take 1 tablet (325 mg total) by mouth daily. 07/30/12   Kaitlyn Szekalski, PA-C  ibuprofen (ADVIL,MOTRIN) 200 MG tablet Take 200 mg by mouth every 6 (six) hours as needed.    Historical Provider, MD  phenazopyridine (PYRIDIUM) 200 MG tablet Take 1 tablet (200 mg total) by mouth 3 (three) times daily. 12/09/13   John Molpus, MD   BP 136/86 mmHg  Pulse 75  Temp(Src) 98.3 F (36.8 C) (Oral)  Resp 16  Ht 5\' 4"  (1.626 m)  Wt 135 lb (61.236 kg)  BMI 23.16 kg/m2  SpO2 100%  LMP 05/09/2014 (Approximate) Physical Exam  Constitutional: She is oriented to person, place, and time. She appears well-developed and well-nourished. No distress.  HENT:  Head: Normocephalic and atraumatic.  Neck: Normal range of motion. Neck supple.  Cardiovascular: Normal rate and regular rhythm.  Exam reveals no gallop and no friction rub.   No murmur heard. Pulmonary/Chest: Effort normal and breath sounds normal. No respiratory distress. She  has no wheezes.  Abdominal: Soft. Bowel sounds are normal. She exhibits no distension. There is no tenderness.  Musculoskeletal: Normal range of motion.  Neurological: She is alert and oriented to person, place, and time.  Skin: Skin is warm and dry. She is not diaphoretic.  Nursing note and vitals reviewed.   ED Course  Procedures (including critical care time) Labs Review Labs Reviewed  URINALYSIS, ROUTINE W REFLEX MICROSCOPIC  PREGNANCY, URINE    Imaging Review No results found.   EKG Interpretation None       MDM   Final diagnoses:  None    Symptoms and UA consistent with a urinary tract infection. This will be treated with Cipro and Pyridium. She is to return as needed for any problems.    Veryl Speak, MD 05/16/14 [phone]

## 2014-06-07 ENCOUNTER — Encounter (HOSPITAL_BASED_OUTPATIENT_CLINIC_OR_DEPARTMENT_OTHER): Payer: Self-pay | Admitting: Emergency Medicine

## 2014-06-07 ENCOUNTER — Emergency Department (HOSPITAL_BASED_OUTPATIENT_CLINIC_OR_DEPARTMENT_OTHER)
Admission: EM | Admit: 2014-06-07 | Discharge: 2014-06-07 | Disposition: A | Discharge status: Home / Self Care | Payer: Medicaid Other | Attending: Emergency Medicine | Admitting: Emergency Medicine

## 2014-06-07 ENCOUNTER — Emergency Department (HOSPITAL_BASED_OUTPATIENT_CLINIC_OR_DEPARTMENT_OTHER): Payer: Medicaid Other

## 2014-06-07 DIAGNOSIS — Z792 Long term (current) use of antibiotics: Secondary | ICD-10-CM | POA: Insufficient documentation

## 2014-06-07 DIAGNOSIS — Z79899 Other long term (current) drug therapy: Secondary | ICD-10-CM | POA: Diagnosis not present

## 2014-06-07 DIAGNOSIS — Z3202 Encounter for pregnancy test, result negative: Secondary | ICD-10-CM | POA: Insufficient documentation

## 2014-06-07 DIAGNOSIS — Z8639 Personal history of other endocrine, nutritional and metabolic disease: Secondary | ICD-10-CM | POA: Diagnosis not present

## 2014-06-07 DIAGNOSIS — R1084 Generalized abdominal pain: Secondary | ICD-10-CM | POA: Diagnosis present

## 2014-06-07 DIAGNOSIS — D649 Anemia, unspecified: Secondary | ICD-10-CM | POA: Insufficient documentation

## 2014-06-07 DIAGNOSIS — Z86018 Personal history of other benign neoplasm: Secondary | ICD-10-CM | POA: Insufficient documentation

## 2014-06-07 DIAGNOSIS — Z87891 Personal history of nicotine dependence: Secondary | ICD-10-CM | POA: Insufficient documentation

## 2014-06-07 DIAGNOSIS — K529 Noninfective gastroenteritis and colitis, unspecified: Secondary | ICD-10-CM | POA: Diagnosis not present

## 2014-06-07 DIAGNOSIS — R112 Nausea with vomiting, unspecified: Secondary | ICD-10-CM

## 2014-06-07 LAB — COMPREHENSIVE METABOLIC PANEL
ALK PHOS: 50 U/L (ref 38–126)
ALT: 9 U/L — AB (ref 14–54)
ANION GAP: 9 (ref 5–15)
AST: 16 U/L (ref 15–41)
Albumin: 3.9 g/dL (ref 3.5–5.0)
BUN: 10 mg/dL (ref 6–20)
CALCIUM: 8.8 mg/dL — AB (ref 8.9–10.3)
CO2: 21 mmol/L — ABNORMAL LOW (ref 22–32)
CREATININE: 0.49 mg/dL (ref 0.44–1.00)
Chloride: 109 mmol/L (ref 101–111)
GFR calc non Af Amer: >60 mL/min (ref >60)
Glucose, Bld: 96 mg/dL (ref 65–99)
POTASSIUM: 3.3 mmol/L — AB (ref 3.5–5.1)
SODIUM: 139 mmol/L (ref 135–145)
Total Bilirubin: 1 mg/dL (ref 0.3–1.2)
Total Protein: 6.9 g/dL (ref 6.5–8.1)

## 2014-06-07 LAB — URINE MICROSCOPIC-ADD ON

## 2014-06-07 LAB — CBC WITH DIFFERENTIAL/PLATELET
Basophils Absolute: 0 10*3/uL (ref 0.0–0.1)
Basophils Relative: 0 % (ref 0–1)
EOS PCT: 1 % (ref 0–5)
Eosinophils Absolute: 0.1 10*3/uL (ref 0.0–0.7)
HEMATOCRIT: 36.4 % (ref 36.0–46.0)
HEMOGLOBIN: 12 g/dL (ref 12.0–15.0)
LYMPHS ABS: 0.6 10*3/uL — AB (ref 0.7–4.0)
Lymphocytes Relative: 5 % — ABNORMAL LOW (ref 12–46)
MCH: 29.9 pg (ref 26.0–34.0)
MCHC: 33 g/dL (ref 30.0–36.0)
MCV: 90.5 fL (ref 78.0–100.0)
MONO ABS: 0.6 10*3/uL (ref 0.1–1.0)
MONOS PCT: 5 % (ref 3–12)
Neutro Abs: 10.6 10*3/uL — ABNORMAL HIGH (ref 1.7–7.7)
Neutrophils Relative %: 89 % — ABNORMAL HIGH (ref 43–77)
Platelets: 176 10*3/uL (ref 150–400)
RBC: 4.02 MIL/uL (ref 3.87–5.11)
RDW: 13 % (ref 11.5–15.5)
WBC: 11.8 10*3/uL — AB (ref 4.0–10.5)

## 2014-06-07 LAB — URINALYSIS, ROUTINE W REFLEX MICROSCOPIC
GLUCOSE, UA: NEGATIVE mg/dL
Hgb urine dipstick: NEGATIVE
Ketones, ur: 15 mg/dL — AB
Nitrite: NEGATIVE
PH: 5 (ref 5.0–8.0)
Protein, ur: 30 mg/dL — AB
Specific Gravity, Urine: 1.029 (ref 1.005–1.030)
Urobilinogen, UA: 1 mg/dL (ref 0.0–1.0)

## 2014-06-07 LAB — LIPASE, BLOOD: Lipase: 24 U/L (ref 22–51)

## 2014-06-07 LAB — I-STAT CG4 LACTIC ACID, ED: Lactic Acid, Venous: 0.68 mmol/L (ref 0.5–2.0)

## 2014-06-07 LAB — PREGNANCY, URINE: PREG TEST UR: NEGATIVE

## 2014-06-07 MED ORDER — SODIUM CHLORIDE 0.9 % IV BOLUS (SEPSIS)
1000.0000 mL | Freq: Once | INTRAVENOUS | Status: AC
  Administered 2014-06-07: 1000 mL via INTRAVENOUS

## 2014-06-07 MED ORDER — ONDANSETRON 4 MG PO TBDP: ORAL_TABLET | ORAL | Status: DC

## 2014-06-07 MED ORDER — GI COCKTAIL ~~LOC~~
30.0000 mL | Freq: Once | ORAL | Status: AC
  Administered 2014-06-07: 30 mL via ORAL
  Filled 2014-06-07: qty 30

## 2014-06-07 MED ORDER — DICYCLOMINE HCL 20 MG PO TABS
20.0000 mg | ORAL_TABLET | Freq: Once | ORAL | Status: AC
Start: 2014-06-07 — End: 2014-06-07
  Filled 2014-06-07: qty 1

## 2014-06-07 MED ORDER — IOHEXOL 300 MG/ML  SOLN
25.0000 mL | Freq: Once | INTRAMUSCULAR | Status: AC | PRN
  Administered 2014-06-07: 25 mL via ORAL

## 2014-06-07 MED ORDER — IOHEXOL 300 MG/ML  SOLN
100.0000 mL | Freq: Once | INTRAMUSCULAR | Status: AC | PRN
  Administered 2014-06-07: 100 mL via INTRAVENOUS

## 2014-06-07 MED ORDER — ONDANSETRON HCL 4 MG/2ML IJ SOLN
4.0000 mg | Freq: Once | INTRAMUSCULAR | Status: AC
  Administered 2014-06-07: 4 mg via INTRAVENOUS
  Filled 2014-06-07: qty 2

## 2014-06-07 MED ORDER — DICYCLOMINE HCL 10 MG PO CAPS
ORAL_CAPSULE | ORAL | Status: AC
  Administered 2014-06-07: 20 mg
  Filled 2014-06-07: qty 2

## 2014-06-07 MED ORDER — HYDROMORPHONE HCL 1 MG/ML IJ SOLN
1.0000 mg | Freq: Once | INTRAMUSCULAR | Status: AC
  Administered 2014-06-07: 1 mg via INTRAVENOUS
  Filled 2014-06-07: qty 1

## 2014-06-07 MED ORDER — PANTOPRAZOLE SODIUM 40 MG PO TBEC
40.0000 mg | DELAYED_RELEASE_TABLET | Freq: Once | ORAL | Status: AC
  Administered 2014-06-07: 40 mg via ORAL
  Filled 2014-06-07: qty 1

## 2014-06-07 MED ORDER — HYDROMORPHONE HCL 1 MG/ML IJ SOLN: 1.0000 mg | Freq: Once | INTRAMUSCULAR | Status: DC

## 2014-06-07 NOTE — Discharge Instructions (Signed)

## 2014-06-07 NOTE — ED Notes (Signed)
Pt presents with abdominal pain x 1 hour after drinking a redbull and eating a snickers bar.

## 2014-06-07 NOTE — ED Provider Notes (Signed)
CSN: [id number]     Arrival date & time 06/07/14  1538 History  This chart was scribed for Sara Bucy, MD by Stephania Fragmin, ED Scribe. This patient was seen in room MH11/MH11 and the patient's care was started at 4:26 PM.    Chief Complaint  Patient presents with  . Abdominal Pain   Patient is a 49 y.o. female presenting with abdominal pain. The history is provided by the patient. No language interpreter was used.  Abdominal Pain Pain location:  Generalized Pain radiates to:  Does not radiate Pain severity:  Severe Onset quality:  Sudden Duration:  2 hours Timing:  Constant Chronicity:  New Context: eating   Relieved by:  Nothing Worsened by:  Nothing tried Ineffective treatments:  None tried Associated symptoms: nausea and vomiting   Associated symptoms: no dysuria and no hematemesis      HPI Comments: Sara Travis is a 49 y.o. female who presents to the Emergency Department complaining of severe abdominal pain after drinking a Redbull and eating a Snickers chocolate bar, with onset about 2 hours ago. She also complains of 4 episodes of vomiting, as well as dizziness before and during vomiting. Patient had just come back from fishing [place] in [place] for vacation. She denies any EtOH consumption. This is a new problem. Patient has a history of anemia and fibroids treated by ablation. She denies GERD. She denies diarrhea, hematemesis, dysuria, or vaginal pain. She denies a possibility of pregnancy.    Past Medical History  Diagnosis Date  . Fibroid   . Headache     migraines  . Vitamin D deficiency   . Anemia     iron deficiency anemia   Past Surgical History  Procedure Laterality Date  . Cesarean section      x 2  . Ablation  04/24/13    thermal ablation of endometrium (Thermochoice Balloon Ablation)  . Breast surgery Right     lumpectomy  . Dilation and curettage of uterus  01/16/13   History reviewed. No pertinent family history. History  Substance Use Topics   . Smoking status: Former Smoker -- 0.20 packs/day for 2 years    Types: Cigars    Quit date: 04/12/2012  . Smokeless tobacco: Never Used  . Alcohol Use: Yes     Comment: occasionally drinks wine   OB History    No data available     Review of Systems  Gastrointestinal: Positive for nausea, vomiting and abdominal pain. Negative for hematemesis.  Genitourinary: Negative for dysuria.  All other systems reviewed and are negative.    Allergies  Review of patient's allergies indicates no known allergies.  Home Medications   Prior to Admission medications   Medication Sig Start Date End Date Taking? Authorizing Provider  ciprofloxacin (CIPRO) 500 MG tablet Take 1 tablet (500 mg total) by mouth 2 (two) times daily. 05/16/14   Veryl Speak, MD  ferrous sulfate 325 (65 FE) MG tablet Take 1 tablet (325 mg total) by mouth daily. 07/30/12   Kaitlyn Szekalski, PA-C  ibuprofen (ADVIL,MOTRIN) 200 MG tablet Take 200 mg by mouth every 6 (six) hours as needed.    Historical Provider, MD  phenazopyridine (PYRIDIUM) 200 MG tablet Take 1 tablet (200 mg total) by mouth 3 (three) times daily. 05/16/14   Veryl Speak, MD   BP 154/72 mmHg  Pulse 75  Resp 18  Ht 5\' 3"  (1.6 m)  Wt 140 lb (63.504 kg)  BMI 24.81 kg/m2  SpO2 100%  LMP 05/10/2014 Physical Exam  Constitutional: She is oriented to person, place, and time. She appears well-developed and well-nourished. No distress.  HENT:  Head: Normocephalic and atraumatic.  Mouth/Throat: Oropharynx is clear and moist.  Eyes: EOM are normal. Pupils are equal, round, and reactive to light.  Neck: Normal range of motion. Neck supple.  Cardiovascular: Normal rate and regular rhythm.  Exam reveals no friction rub.   No murmur heard. Pulmonary/Chest: Effort normal and breath sounds normal. No respiratory distress. She has no wheezes. She has no rales.  Abdominal: Soft. She exhibits no distension. There is tenderness (diffuse, severe). There is no rebound and  no guarding.  Musculoskeletal: Normal range of motion. She exhibits no edema.  Neurological: She is alert and oriented to person, place, and time.  Skin: She is not diaphoretic.  Nursing note and vitals reviewed.   ED Course  Procedures (including critical care time)  DIAGNOSTIC STUDIES: Oxygen Saturation is 100% on RA, normal by my interpretation.    COORDINATION OF CARE: 4:30 PM - Discussed treatment plan with pt at bedside which includes CT abdomen, pain medication, and nausea medication, and pt agreed to plan.   Labs Review Labs Reviewed  URINALYSIS, ROUTINE W REFLEX MICROSCOPIC (NOT AT [facility]) - Abnormal; Notable for the following:    APPearance CLOUDY (*)    Bilirubin Urine SMALL (*)    Ketones, ur 15 (*)    Protein, ur 30 (*)    Leukocytes, UA SMALL (*)    All other components within normal limits  CBC WITH DIFFERENTIAL/PLATELET - Abnormal; Notable for the following:    WBC 11.8 (*)    Neutrophils Relative % 89 (*)    Neutro Abs 10.6 (*)    Lymphocytes Relative 5 (*)    Lymphs Abs 0.6 (*)    All other components within normal limits  COMPREHENSIVE METABOLIC PANEL - Abnormal; Notable for the following:    Potassium 3.3 (*)    CO2 21 (*)    Calcium 8.8 (*)    ALT 9 (*)    All other components within normal limits  URINE MICROSCOPIC-ADD ON - Abnormal; Notable for the following:    Squamous Epithelial / LPF FEW (*)    Bacteria, UA MANY (*)    All other components within normal limits  PREGNANCY, URINE  LIPASE, BLOOD  I-STAT CG4 LACTIC ACID, ED    Imaging Review Ct Abdomen Pelvis W Contrast  06/07/2014   CLINICAL DATA:  Diffuse abdominal pain today.  EXAM: CT ABDOMEN AND PELVIS WITH CONTRAST  TECHNIQUE: Multidetector CT imaging of the abdomen and pelvis was performed using the standard protocol following bolus administration of intravenous contrast.  CONTRAST:  100 mL OMNIPAQUE IOHEXOL 300 MG/ML SOLN, 25 mL OMNIPAQUE IOHEXOL 300 MG/ML SOLN  COMPARISON:  CT abdomen and  pelvis 06/18/2013.  FINDINGS: The lung bases are clear. No pleural or pericardial effusion. Heart size is upper normal.  A 2.8 cm hypo attenuating lesion in the left hepatic lobe is consistent with a cyst. The liver is otherwise unremarkable. The gallbladder, adrenal glands, spleen, pancreas, biliary tree and left kidney appear normal. A 0.8 cm hypo attenuating lesion the right kidney is most consistent with a cyst.  Fibroid uterus is identified as on the prior examinations. A fibroid in the anterior aspect of the uterus in the fundus measures 5.8 cm craniocaudal by 5.6 cm transverse by 3.8 cm AP and has areas of low attenuation consistent with involution related to prior ablation. A low  attenuating lesion left ovary measuring 3.4 x 4.4 x 4.5 cm is identified. The patient is status post tubal ligation.  A small volume of free pelvic fluid is most consistent with physiologic change. Diffuse, mild haziness is seen throughout the mesentery. The stomach and small and large bowel are unremarkable. No lymphadenopathy is identified. No focal bony abnormality is seen.  IMPRESSION: Haziness of the mesentery throughout is nonspecific but may be reflect changes related to enteritis. There is no evidence of bowel obstruction or ischemia.  Involuting dominant uterine fibroid consistent with history of ablation.  Cystic adnexal lesion on the left measures a maximum of 4.4 cm. No further imaging is recommended. This recommendation follows ACR consensus guidelines: White Paper of the ACR Incidental Findings Committee II on Adnexal Findings. J Am Coll Radiol [phone].   Electronically Signed   By: Inge Rise M.D.   On: 06/07/2014 17:37     EKG Interpretation None      MDM   Final diagnoses:  Non-intractable vomiting with nausea, vomiting of unspecified type  Enteritis    49 year old female here with abdominal pain. Began suddenly after eating a chocolate bar which was preceded by a red bull. She was tired  after standing up fishing. Denies any recent all use. She had 4 episodes of vomiting. No diarrhea, dysuria. Here she has stable vitals. She has diffuse severe abdominal pain. Will CT scan her belly.  CT shows haziness in the mesentery concerning for enteritis. No obstruction or ischemia. She is having some improvement with pain medicine and nausea medicine, but still feeling bad. After finishing drinking red bull, suspect she has some gastritis. Given Zofran. Stable for discharge.  I personally performed the services described in this documentation, which was scribed in my presence. The recorded information has been reviewed and is accurate.      Sara Bucy, MD 06/07/14 [phone]

## 2014-06-29 ENCOUNTER — Other Ambulatory Visit: Payer: Self-pay | Admitting: Interventional Radiology

## 2014-06-29 DIAGNOSIS — D259 Leiomyoma of uterus, unspecified: Secondary | ICD-10-CM

## 2014-07-21 ENCOUNTER — Other Ambulatory Visit: Payer: Medicaid Other

## 2014-07-24 ENCOUNTER — Encounter (HOSPITAL_BASED_OUTPATIENT_CLINIC_OR_DEPARTMENT_OTHER): Payer: Self-pay | Admitting: *Deleted

## 2014-07-24 ENCOUNTER — Emergency Department (HOSPITAL_BASED_OUTPATIENT_CLINIC_OR_DEPARTMENT_OTHER)
Admission: EM | Admit: 2014-07-24 | Discharge: 2014-07-24 | Disposition: A | Discharge status: Home / Self Care | Payer: Medicaid Other | Attending: Emergency Medicine | Admitting: Emergency Medicine

## 2014-07-24 DIAGNOSIS — Z79899 Other long term (current) drug therapy: Secondary | ICD-10-CM | POA: Insufficient documentation

## 2014-07-24 DIAGNOSIS — Z3202 Encounter for pregnancy test, result negative: Secondary | ICD-10-CM | POA: Insufficient documentation

## 2014-07-24 DIAGNOSIS — D509 Iron deficiency anemia, unspecified: Secondary | ICD-10-CM | POA: Insufficient documentation

## 2014-07-24 DIAGNOSIS — Z8679 Personal history of other diseases of the circulatory system: Secondary | ICD-10-CM | POA: Diagnosis not present

## 2014-07-24 DIAGNOSIS — N3000 Acute cystitis without hematuria: Secondary | ICD-10-CM

## 2014-07-24 DIAGNOSIS — Z720 Tobacco use: Secondary | ICD-10-CM | POA: Diagnosis not present

## 2014-07-24 DIAGNOSIS — Z86018 Personal history of other benign neoplasm: Secondary | ICD-10-CM | POA: Diagnosis not present

## 2014-07-24 DIAGNOSIS — Z8639 Personal history of other endocrine, nutritional and metabolic disease: Secondary | ICD-10-CM | POA: Diagnosis not present

## 2014-07-24 DIAGNOSIS — J029 Acute pharyngitis, unspecified: Secondary | ICD-10-CM | POA: Insufficient documentation

## 2014-07-24 DIAGNOSIS — R1030 Lower abdominal pain, unspecified: Secondary | ICD-10-CM | POA: Diagnosis present

## 2014-07-24 LAB — URINALYSIS, ROUTINE W REFLEX MICROSCOPIC
Glucose, UA: NEGATIVE mg/dL
Hgb urine dipstick: NEGATIVE
Ketones, ur: 15 mg/dL — AB
Nitrite: POSITIVE — AB
PROTEIN: NEGATIVE mg/dL
Specific Gravity, Urine: 1.023 (ref 1.005–1.030)
Urobilinogen, UA: 1 mg/dL (ref 0.0–1.0)
pH: 5 (ref 5.0–8.0)

## 2014-07-24 LAB — URINE MICROSCOPIC-ADD ON

## 2014-07-24 LAB — RAPID STREP SCREEN (MED CTR MEBANE ONLY): Streptococcus, Group A Screen (Direct): NEGATIVE

## 2014-07-24 LAB — PREGNANCY, URINE: Preg Test, Ur: NEGATIVE

## 2014-07-24 MED ORDER — CEPHALEXIN 500 MG PO CAPS: 500.0000 mg | ORAL_CAPSULE | Freq: Three times a day (TID) | ORAL | Status: DC

## 2014-07-24 MED ORDER — PHENAZOPYRIDINE HCL 200 MG PO TABS
200.0000 mg | ORAL_TABLET | Freq: Three times a day (TID) | ORAL | Status: DC | PRN
Start: 2014-07-24 — End: 2015-02-03

## 2014-07-24 MED ORDER — CEPHALEXIN 250 MG PO CAPS
500.0000 mg | ORAL_CAPSULE | Freq: Once | ORAL | Status: AC
  Administered 2014-07-24: 500 mg via ORAL
  Filled 2014-07-24: qty 2

## 2014-07-24 MED ORDER — IBUPROFEN 800 MG PO TABS
800.0000 mg | ORAL_TABLET | Freq: Once | ORAL | Status: AC
  Administered 2014-07-24: 800 mg via ORAL
  Filled 2014-07-24: qty 1

## 2014-07-24 NOTE — Discharge Instructions (Signed)

## 2014-07-24 NOTE — ED Notes (Addendum)
C/o fibroids has had tx in past for same and pain is low mid abd pain. No urinary sx. No n/v/d. Also c/o sorethroat and bil ear pain x 2 1/2 weeks.

## 2014-07-24 NOTE — ED Provider Notes (Signed)
CSN: [id number]     Arrival date & time 07/24/14  0749 History   First MD Initiated Contact with Patient 07/24/14 0801     No chief complaint on file.    (Consider location/radiation/quality/duration/timing/severity/associated sxs/prior Treatment) HPI Comments: Hx of fibroids and UTI. Feels suprapubic pressure and is worried she's about to get a UTI. Doesn't take any pain medicine. Is following with GYN for fibroid removal/workup. She's had a surgery previously to help decrease blood flow to her fibroid to help eliminate her pain. She wants to go ahead and have a hysterectomy.  Patient is a 49 y.o. female presenting with abdominal pain and pharyngitis. The history is provided by the patient.  Abdominal Pain Pain location:  Suprapubic Pain quality: aching   Pain radiates to:  Does not radiate Pain severity:  Mild Onset quality:  Gradual Timing:  Intermittent Progression:  Unchanged Chronicity:  Chronic Relieved by:  Nothing Worsened by:  Nothing tried Associated symptoms: no chest pain, no cough, no fever and no shortness of breath   Sore Throat This is a new problem. The current episode started more than 1 week ago. The problem occurs constantly. The problem has been gradually worsening. Associated symptoms include abdominal pain. Pertinent negatives include no chest pain and no shortness of breath. Nothing aggravates the symptoms. Nothing relieves the symptoms.    Past Medical History  Diagnosis Date  . Fibroid   . Headache     migraines  . Vitamin D deficiency   . Anemia     iron deficiency anemia   Past Surgical History  Procedure Laterality Date  . Cesarean section      x 2  . Ablation  04/24/13    thermal ablation of endometrium (Thermochoice Balloon Ablation)  . Breast surgery Right     lumpectomy  . Dilation and curettage of uterus  01/16/13   No family history on file. History  Substance Use Topics  . Smoking status: Current Some Day Smoker -- 0.20 packs/day for  2 years    Types: Cigars    Last Attempt to Quit: 04/12/2012  . Smokeless tobacco: Never Used  . Alcohol Use: Yes     Comment: occasionally drinks wine   OB History    No data available     Review of Systems  Constitutional: Negative for fever.  Respiratory: Negative for cough and shortness of breath.   Cardiovascular: Negative for chest pain.  Gastrointestinal: Positive for abdominal pain.  All other systems reviewed and are negative.     Allergies  Review of patient's allergies indicates no known allergies.  Home Medications   Prior to Admission medications   Medication Sig Start Date End Date Taking? Authorizing Provider  ferrous sulfate 325 (65 FE) MG tablet Take 1 tablet (325 mg total) by mouth daily. 07/30/12  Yes Kaitlyn Szekalski, PA-C   BP 159/99 mmHg  Pulse 68  Temp(Src) 98.4 F (36.9 C) (Oral)  Resp 18  SpO2 100%  LMP 07/08/2014 Physical Exam  Constitutional: She is oriented to person, place, and time. She appears well-developed and well-nourished. No distress.  HENT:  Head: Normocephalic and atraumatic.  Mouth/Throat: Posterior oropharyngeal erythema (mild, across posterior palate) present.  Eyes: EOM are normal. Pupils are equal, round, and reactive to light.  Neck: Normal range of motion. Neck supple.  Cardiovascular: Normal rate and regular rhythm.  Exam reveals no friction rub.   No murmur heard. Pulmonary/Chest: Effort normal and breath sounds normal. No respiratory distress. She has no  wheezes. She has no rales.  Abdominal: Soft. She exhibits no distension. There is no tenderness. There is no rebound.  Musculoskeletal: Normal range of motion. She exhibits no edema.  Neurological: She is alert and oriented to person, place, and time.  Skin: No rash noted. She is not diaphoretic.  Nursing note and vitals reviewed.   ED Course  Procedures (including critical care time) Labs Review Labs Reviewed  URINALYSIS, ROUTINE W REFLEX MICROSCOPIC (NOT AT  [facility]) - Abnormal; Notable for the following:    Color, Urine ORANGE (*)    APPearance TURBID (*)    Bilirubin Urine SMALL (*)    Ketones, ur 15 (*)    Nitrite POSITIVE (*)    Leukocytes, UA MODERATE (*)    All other components within normal limits  URINE MICROSCOPIC-ADD ON - Abnormal; Notable for the following:    Squamous Epithelial / LPF FEW (*)    Bacteria, UA MANY (*)    All other components within normal limits  RAPID STREP SCREEN (NOT AT [facility])  CULTURE, GROUP A STREP  PREGNANCY, URINE    Imaging Review No results found.   EKG Interpretation None      MDM   Final diagnoses:  Acute cystitis without hematuria    61F here with lower abdominal pain. Hx of fibroids, reports she feels suprapubic pressure like she's going to get a UTI. Pain is also associated with history of fibroids. She's not on any narcotics for this at this time. No fever, vomiting, diarrhea. Mild suprapubic pain here. We'll check a urine. She has follow-up with GYN for further testing. She's also having some throat pain which started about 2 weeks ago. Is radiating to her ear. No fevers. No pain with swallowing. She has some mild posterior erythema but no tonsillar swelling, shifting, abscess. Will send a rapid strep. Strep negative. UTI shown on UA. Given Keflex. Stable for discharge. He reported him also for bladder spasms.   Evelina Bucy, MD 07/24/14 [phone]

## 2014-07-26 LAB — CULTURE, GROUP A STREP: Strep A Culture: NEGATIVE

## 2014-08-03 ENCOUNTER — Encounter (HOSPITAL_BASED_OUTPATIENT_CLINIC_OR_DEPARTMENT_OTHER): Payer: Self-pay | Admitting: *Deleted

## 2014-08-03 ENCOUNTER — Emergency Department (HOSPITAL_BASED_OUTPATIENT_CLINIC_OR_DEPARTMENT_OTHER)
Admission: EM | Admit: 2014-08-03 | Discharge: 2014-08-03 | Disposition: A | Discharge status: Home / Self Care | Payer: Medicaid Other | Attending: Emergency Medicine | Admitting: Emergency Medicine

## 2014-08-03 DIAGNOSIS — Z792 Long term (current) use of antibiotics: Secondary | ICD-10-CM | POA: Diagnosis not present

## 2014-08-03 DIAGNOSIS — B373 Candidiasis of vulva and vagina: Secondary | ICD-10-CM

## 2014-08-03 DIAGNOSIS — Z79899 Other long term (current) drug therapy: Secondary | ICD-10-CM | POA: Insufficient documentation

## 2014-08-03 DIAGNOSIS — Z3202 Encounter for pregnancy test, result negative: Secondary | ICD-10-CM | POA: Insufficient documentation

## 2014-08-03 DIAGNOSIS — Z720 Tobacco use: Secondary | ICD-10-CM | POA: Insufficient documentation

## 2014-08-03 DIAGNOSIS — Z8739 Personal history of other diseases of the musculoskeletal system and connective tissue: Secondary | ICD-10-CM | POA: Diagnosis not present

## 2014-08-03 DIAGNOSIS — Z87440 Personal history of urinary (tract) infections: Secondary | ICD-10-CM | POA: Insufficient documentation

## 2014-08-03 DIAGNOSIS — Z8679 Personal history of other diseases of the circulatory system: Secondary | ICD-10-CM | POA: Insufficient documentation

## 2014-08-03 DIAGNOSIS — D509 Iron deficiency anemia, unspecified: Secondary | ICD-10-CM | POA: Diagnosis not present

## 2014-08-03 DIAGNOSIS — Z86018 Personal history of other benign neoplasm: Secondary | ICD-10-CM | POA: Diagnosis not present

## 2014-08-03 DIAGNOSIS — B3731 Acute candidiasis of vulva and vagina: Secondary | ICD-10-CM

## 2014-08-03 DIAGNOSIS — N898 Other specified noninflammatory disorders of vagina: Secondary | ICD-10-CM | POA: Diagnosis present

## 2014-08-03 LAB — URINALYSIS, ROUTINE W REFLEX MICROSCOPIC
Bilirubin Urine: NEGATIVE
GLUCOSE, UA: NEGATIVE mg/dL
Ketones, ur: NEGATIVE mg/dL
Nitrite: NEGATIVE
PH: 5.5 (ref 5.0–8.0)
Protein, ur: NEGATIVE mg/dL
Specific Gravity, Urine: 1.027 (ref 1.005–1.030)
Urobilinogen, UA: 1 mg/dL (ref 0.0–1.0)

## 2014-08-03 LAB — WET PREP, GENITAL
Clue Cells Wet Prep HPF POC: NONE SEEN
Trich, Wet Prep: NONE SEEN

## 2014-08-03 LAB — URINE MICROSCOPIC-ADD ON

## 2014-08-03 LAB — PREGNANCY, URINE: Preg Test, Ur: NEGATIVE

## 2014-08-03 MED ORDER — FLUCONAZOLE 50 MG PO TABS
150.0000 mg | ORAL_TABLET | Freq: Once | ORAL | Status: AC
  Administered 2014-08-03: 150 mg via ORAL
  Filled 2014-08-03 (×2): qty 1

## 2014-08-03 NOTE — Discharge Instructions (Signed)
° °  Candidal Vulvovaginitis Candidal vulvovaginitis is an infection of the vagina and vulva. The vulva is the skin around the opening of the vagina. This may cause itching and discomfort in and around the vagina.  HOME CARE  Only take medicine as told by your doctor.  Do not have sex (intercourse) until the infection is healed or as told by your doctor.  Practice safe sex.  Do not douche or use tampons.  Wear cotton underwear. Do not wear tight pants or panty hose.  Eat yogurt. This may help treat and prevent yeast infections. GET HELP RIGHT AWAY IF:   You have a fever.  Your problems get worse during treatment or do not get better in 3 days.  You have discomfort, irritation, or itching in your vagina or vulva area.  You have pain after sex.  You start to get belly (abdominal) pain. MAKE SURE YOU:  Understand these instructions.  Will watch your condition.  Will get help right away if you are not doing well or get worse. Document Released: 03/24/2008 Document Revised: 12/31/2012 Document Reviewed: 03/24/2008 [facility] Patient Information 2015 [place], [place]. This information is not intended to replace advice given to you by your health care provider. Make sure you discuss any questions you have with your health care provider.

## 2014-08-03 NOTE — ED Provider Notes (Signed)
CSN: [id number]     Arrival date & time 08/03/14  0344 History   First MD Initiated Contact with Patient 08/03/14 0400     Chief Complaint  Patient presents with  . Vaginal Swelling      (Consider location/radiation/quality/duration/timing/severity/associated sxs/prior Treatment) HPI  This is a 49 year old female recently treated for urinary tract infection with Keflex. She is here with a three-day history of vulvovaginal swelling. Symptoms are mild to moderate. She denies pain, dysuria, fever, chills, vaginal bleeding or vaginal discharge.   Past Medical History  Diagnosis Date  . Fibroid   . Headache     migraines  . Vitamin D deficiency   . Anemia     iron deficiency anemia   Past Surgical History  Procedure Laterality Date  . Cesarean section      x 2  . Ablation  04/24/13    thermal ablation of endometrium (Thermochoice Balloon Ablation)  . Breast surgery Right     lumpectomy  . Dilation and curettage of uterus  01/16/13   No family history on file. History  Substance Use Topics  . Smoking status: Current Some Day Smoker -- 0.20 packs/day for 2 years    Types: Cigars    Last Attempt to Quit: 04/12/2012  . Smokeless tobacco: Never Used  . Alcohol Use: Yes     Comment: occasionally drinks wine   OB History    No data available     Review of Systems  All other systems reviewed and are negative.   Allergies  Review of patient's allergies indicates no known allergies.  Home Medications   Prior to Admission medications   Medication Sig Start Date End Date Taking? Authorizing Provider  cephALEXin (KEFLEX) 500 MG capsule Take 1 capsule (500 mg total) by mouth 3 (three) times daily. 07/24/14   Evelina Bucy, MD  ferrous sulfate 325 (65 FE) MG tablet Take 1 tablet (325 mg total) by mouth daily. 07/30/12   Kaitlyn Szekalski, PA-C  phenazopyridine (PYRIDIUM) 200 MG tablet Take 1 tablet (200 mg total) by mouth 3 (three) times daily as needed for pain. 07/24/14   Evelina Bucy, MD   BP 155/79 mmHg  Pulse 84  Temp(Src) 98.9 F (37.2 C) (Oral)  Resp 18  Ht 5\' 3"  (1.6 m)  Wt 130 lb (58.968 kg)  BMI 23.03 kg/m2  SpO2 100%  LMP 07/08/2014   Physical Exam  General: Well-developed, well-nourished female in no acute distress; appearance consistent with age of record HENT: normocephalic; atraumatic Eyes: pupils equal, round and reactive to light; extraocular muscles intact Neck: supple Heart: regular rate and rhythm Lungs: clear to auscultation bilaterally Abdomen: soft; nondistended; nontender; bowel sounds present GU: Mild focal vaginal edema; white curd-like vaginal discharge; no vaginal bleeding; no cervical motion tenderness Extremities: No deformity; full range of motion; pulses normal Neurologic: Awake, alert and oriented; motor function intact in all extremities and symmetric; no facial droop Skin: Warm and dry Psychiatric: Normal mood and affect    ED Course  Procedures (including critical care time)   MDM  Nursing notes and vitals signs, including pulse oximetry, reviewed.  Summary of this visit's results, reviewed by myself:  Labs:  Results for orders placed or performed during the hospital encounter of 08/03/14 (from the past 24 hour(s))  Wet prep, genital     Status: Abnormal   Collection Time: 08/03/14  4:10 AM  Result Value Ref Range   Yeast Wet Prep HPF POC MANY (A) NONE SEEN  Trich, Wet Prep NONE SEEN NONE SEEN   Clue Cells Wet Prep HPF POC NONE SEEN NONE SEEN   WBC, Wet Prep HPF POC MANY (A) NONE SEEN  Pregnancy, urine     Status: None   Collection Time: 08/03/14  4:11 AM  Result Value Ref Range   Preg Test, Ur NEGATIVE NEGATIVE  Urinalysis, Routine w reflex microscopic (not at [facility])     Status: Abnormal   Collection Time: 08/03/14  4:11 AM  Result Value Ref Range   Color, Urine YELLOW YELLOW   APPearance CLEAR CLEAR   Specific Gravity, Urine 1.027 1.005 - 1.030   pH 5.5 5.0 - 8.0   Glucose, UA NEGATIVE NEGATIVE  mg/dL   Hgb urine dipstick TRACE (A) NEGATIVE   Bilirubin Urine NEGATIVE NEGATIVE   Ketones, ur NEGATIVE NEGATIVE mg/dL   Protein, ur NEGATIVE NEGATIVE mg/dL   Urobilinogen, UA 1.0 0.0 - 1.0 mg/dL   Nitrite NEGATIVE NEGATIVE   Leukocytes, UA MODERATE (A) NEGATIVE  Urine microscopic-add on     Status: Abnormal   Collection Time: 08/03/14  4:11 AM  Result Value Ref Range   Squamous Epithelial / LPF FEW (A) RARE   WBC, UA 3-6 <3 WBC/hpf   RBC / HPF 0-2 <3 RBC/hpf   Bacteria, UA FEW (A) RARE   Crystals CA OXALATE CRYSTALS (A) NEGATIVE   Urine-Other MUCOUS PRESENT       Shanon Rosser, MD 08/03/14 [phone]

## 2014-08-03 NOTE — ED Notes (Signed)
States recent UTI and has been treated with an antibiotic. States past 3 days she has dev vaginal swelling. Denies any pain, fevers, vaginal discharge. Denies any urinary symptoms.

## 2014-08-04 LAB — GC/CHLAMYDIA PROBE AMP (~~LOC~~) NOT AT ARMC
CHLAMYDIA, DNA PROBE: NEGATIVE
NEISSERIA GONORRHEA: NEGATIVE

## 2014-12-22 ENCOUNTER — Other Ambulatory Visit: Payer: Medicaid Other

## 2014-12-22 ENCOUNTER — Other Ambulatory Visit: Payer: Self-pay | Admitting: Interventional Radiology

## 2014-12-22 DIAGNOSIS — D259 Leiomyoma of uterus, unspecified: Secondary | ICD-10-CM

## 2015-01-26 ENCOUNTER — Other Ambulatory Visit: Payer: Medicaid Other

## 2015-02-03 ENCOUNTER — Encounter (HOSPITAL_BASED_OUTPATIENT_CLINIC_OR_DEPARTMENT_OTHER): Payer: Self-pay

## 2015-02-03 ENCOUNTER — Emergency Department (HOSPITAL_BASED_OUTPATIENT_CLINIC_OR_DEPARTMENT_OTHER)
Admission: EM | Admit: 2015-02-03 | Discharge: 2015-02-04 | Disposition: A | Discharge status: Home / Self Care | Payer: Medicaid Other | Attending: Emergency Medicine | Admitting: Emergency Medicine

## 2015-02-03 DIAGNOSIS — Z3202 Encounter for pregnancy test, result negative: Secondary | ICD-10-CM | POA: Insufficient documentation

## 2015-02-03 DIAGNOSIS — Z8639 Personal history of other endocrine, nutritional and metabolic disease: Secondary | ICD-10-CM | POA: Insufficient documentation

## 2015-02-03 DIAGNOSIS — Z8679 Personal history of other diseases of the circulatory system: Secondary | ICD-10-CM | POA: Insufficient documentation

## 2015-02-03 DIAGNOSIS — Z79899 Other long term (current) drug therapy: Secondary | ICD-10-CM | POA: Insufficient documentation

## 2015-02-03 DIAGNOSIS — Z86018 Personal history of other benign neoplasm: Secondary | ICD-10-CM | POA: Insufficient documentation

## 2015-02-03 DIAGNOSIS — F17200 Nicotine dependence, unspecified, uncomplicated: Secondary | ICD-10-CM | POA: Insufficient documentation

## 2015-02-03 DIAGNOSIS — R197 Diarrhea, unspecified: Secondary | ICD-10-CM | POA: Insufficient documentation

## 2015-02-03 DIAGNOSIS — D509 Iron deficiency anemia, unspecified: Secondary | ICD-10-CM | POA: Insufficient documentation

## 2015-02-03 DIAGNOSIS — N390 Urinary tract infection, site not specified: Secondary | ICD-10-CM

## 2015-02-03 DIAGNOSIS — Z792 Long term (current) use of antibiotics: Secondary | ICD-10-CM | POA: Insufficient documentation

## 2015-02-03 DIAGNOSIS — R319 Hematuria, unspecified: Secondary | ICD-10-CM

## 2015-02-03 LAB — URINE MICROSCOPIC-ADD ON

## 2015-02-03 LAB — URINALYSIS, ROUTINE W REFLEX MICROSCOPIC
Bilirubin Urine: NEGATIVE
GLUCOSE, UA: NEGATIVE mg/dL
Ketones, ur: NEGATIVE mg/dL
LEUKOCYTES UA: NEGATIVE
Nitrite: NEGATIVE
Protein, ur: NEGATIVE mg/dL
Specific Gravity, Urine: 1.028 (ref 1.005–1.030)
pH: 5.5 (ref 5.0–8.0)

## 2015-02-03 LAB — PREGNANCY, URINE: PREG TEST UR: NEGATIVE

## 2015-02-03 MED ORDER — PHENAZOPYRIDINE HCL 100 MG PO TABS
100.0000 mg | ORAL_TABLET | Freq: Once | ORAL | Status: AC
  Administered 2015-02-03: 100 mg via ORAL
  Filled 2015-02-03: qty 1

## 2015-02-03 MED ORDER — PHENAZOPYRIDINE HCL 200 MG PO TABS: 200.0000 mg | ORAL_TABLET | Freq: Three times a day (TID) | ORAL | Status: AC

## 2015-02-03 MED ORDER — CEPHALEXIN 250 MG PO CAPS
500.0000 mg | ORAL_CAPSULE | Freq: Once | ORAL | Status: AC
  Administered 2015-02-03: 500 mg via ORAL
  Filled 2015-02-03: qty 2

## 2015-02-03 MED ORDER — FLUCONAZOLE 150 MG PO TABS
150.0000 mg | ORAL_TABLET | Freq: Once | ORAL | Status: DC
Start: 2015-02-03 — End: 2015-03-13

## 2015-02-03 MED ORDER — LOPERAMIDE HCL 2 MG PO CAPS
4.0000 mg | ORAL_CAPSULE | Freq: Once | ORAL | Status: AC
  Administered 2015-02-03: 4 mg via ORAL
  Filled 2015-02-03: qty 2

## 2015-02-03 MED ORDER — CEPHALEXIN 500 MG PO CAPS: 500.0000 mg | ORAL_CAPSULE | Freq: Four times a day (QID) | ORAL | Status: DC

## 2015-02-03 NOTE — Discharge Instructions (Signed)

## 2015-02-03 NOTE — ED Notes (Signed)
C/o abd pressure-states feels like UTI-denies urinary freq and dysuria-NAD-steady gait

## 2015-02-03 NOTE — ED Notes (Signed)
Pt verbalizes understanding of d/c instructions and denies any further needs at this time. 

## 2015-02-03 NOTE — ED Notes (Signed)
Pt c/o urinary frequency and bladder pressure that started today

## 2015-02-03 NOTE — ED Provider Notes (Signed)
By signing my name below, I, Doran Stabler, attest that this documentation has been prepared under the direction and in the presence of [employer], DO. Electronically Signed: Doran Stabler, ED Scribe. 02/03/2015. 12:17 AM.  TIME SEEN: 11:07 PM  CHIEF COMPLAINT:  Chief Complaint  Patient presents with  . Abdominal Pain    HPI: HPI Comments: Sara Travis is a 50 y.o. female with a PMHx of reoccurring UTI and fibroids who presents to the Emergency Department complaining of constant, ongoing abdominal pain that she describes as suprapubic pressure, dysuria, urinary frequency and urgency. She states she has also had associated diarrhea x 3 that began today in the [place]. Pt attributes diarrhea to lactose intolerance as she had a milkshake today. Pt denies any exacerbating or alleviating factors at this time. Pt also reports subjective fever, chills, vomiting. No OTC medications or home remedies attempted prior to arrival. Pt denies any abnormal vaginal discharge or vaginal bleeding. PSHx includes uterine ablation 6 months. States that this feels exactly like her prior UTIs. She states Keflex has helped her in the past. She does state however that Keflex is cultured have a yeast infection.  ROS: See HPI Constitutional: no fever  Eyes: no drainage  ENT: no runny nose   Cardiovascular:  no chest pain  Resp: no SOB  GI: no vomiting GU: no dysuria Integumentary: no rash  Allergy: no hives  Musculoskeletal: no leg swelling  Neurological: no slurred speech ROS otherwise negative  PAST MEDICAL HISTORY/PAST SURGICAL HISTORY:  Past Medical History  Diagnosis Date  . Fibroid   . Headache     migraines  . Vitamin D deficiency   . Anemia     iron deficiency anemia    MEDICATIONS:  Prior to Admission medications   Medication Sig Start Date End Date Taking? Authorizing Provider  cephALEXin (KEFLEX) 500 MG capsule Take 1 capsule (500 mg total) by mouth 3 (three) times daily. 07/24/14    Evelina Bucy, MD  ferrous sulfate 325 (65 FE) MG tablet Take 1 tablet (325 mg total) by mouth daily. 07/30/12   Alvina Chou, PA-C    ALLERGIES:  No Known Allergies  SOCIAL HISTORY:  Social History  Substance Use Topics  . Smoking status: Current Some Day Smoker -- 0.20 packs/day for 2 years  . Smokeless tobacco: Never Used  . Alcohol Use: No    FAMILY HISTORY: No family history on file.  EXAM: BP 161/93 mmHg  Pulse 76  Temp(Src) 98.2 F (36.8 C) (Oral)  Resp 16  Ht 5\' 3"  (1.6 m)  Wt 130 lb (58.968 kg)  BMI 23.03 kg/m2  SpO2 100%  LMP  (LMP Unknown) CONSTITUTIONAL: Alert and oriented and responds appropriately to questions. Well-appearing; well-nourished, afebrile, nontoxic HEAD: Normocephalic EYES: Conjunctivae clear, PERRL ENT: normal nose; no rhinorrhea; moist mucous membranes; pharynx without lesions noted NECK: Supple, no meningismus, no LAD  CARD: RRR; S1 and S2 appreciated; no murmurs, no clicks, no rubs, no gallops RESP: Normal chest excursion without splinting or tachypnea; breath sounds clear and equal bilaterally; no wheezes, no rhonchi, no rales, no hypoxia or respiratory distress, speaking full sentences ABD/GI: Normal bowel sounds; non-distended; soft, non-tender, no rebound, no guarding, no peritoneal signs BACK:  The back appears normal and is non-tender to palpation, there is no CVA tenderness EXT: Normal ROM in all joints; non-tender to palpation; no edema; normal capillary refill; no cyanosis, no calf tenderness or swelling    SKIN: Normal color for age and race; warm NEURO:  Moves all extremities equally, sensation to light touch intact diffusely, cranial nerves II through XII intact PSYCH: The patient's mood and manner are appropriate. Grooming and personal hygiene are appropriate.  MEDICAL DECISION MAKING: Patient with symptoms of UTI. Urine shows trace hemoglobin and many bacteria. No other sign of infection but this may be early. She has had UTIs  several times in the past and states this feels exactly the same. No other GU symptoms. Abdominal exam is benign. Has had 3 episodes of diarrhea but states this was from drinking a milkshake today. No vomiting. No fever. Doubt appendicitis, colitis, diverticulitis, bowel obstruction. Doubt pyelonephritis. Will send urine culture. Will treat with Keflex and she has grown Escherichia coli in the past that was pansensitive. Will give dose of Diflucan given she develops yeast infections after oral antibiotics. I do not feel this time she needs further emergent workup. I do not think she needs abdominal imaging. I feel she is safe to be discharged home. Discussed return precautions. She verbalized understanding and is comfortable with this plan.   I personally performed the services described in this documentation, which was scribed in my presence. The recorded information has been reviewed and is accurate.    [place], DO 02/04/15 [phone]

## 2015-02-04 MED FILL — FLUCONAZOLE 150 MG TABLET: 150 | 1 days supply | Qty: 1 | Fill #0

## 2015-02-04 MED FILL — CEPHALEXIN 500 MG CAPSULE: 500 | 5 days supply | Qty: 20 | Fill #0

## 2015-02-06 LAB — URINE CULTURE

## 2015-02-08 ENCOUNTER — Telehealth (HOSPITAL_COMMUNITY): Payer: Self-pay

## 2015-02-08 NOTE — Telephone Encounter (Signed)
Post ED Visit - Positive Culture Follow-up  Culture report reviewed by antimicrobial stewardship pharmacist:  []  Elenor Quinones, Pharm.D. []  Heide Guile, Pharm.D., BCPS [x]  Parks Neptune, Pharm.D. []  Alycia Rossetti, Pharm.D., BCPS []  [place], Pharm.D., BCPS, AAHIVP []  Legrand Como, Pharm.D., BCPS, AAHIVP []  Milus Glazier, Pharm.D. []  Rob Evette Doffing, Pharm.D.  Positive urine culture, >/= 100,000 colonies -> E Coli Treated with Cephalexin, organism sensitive to the same and no further patient follow-up is required at this time.  Dortha Kern 02/08/2015, 3:21 AM

## 2015-03-13 ENCOUNTER — Emergency Department (HOSPITAL_BASED_OUTPATIENT_CLINIC_OR_DEPARTMENT_OTHER)
Admission: EM | Admit: 2015-03-13 | Discharge: 2015-03-13 | Disposition: A | Discharge status: Home / Self Care | Payer: Medicaid Other | Attending: Emergency Medicine | Admitting: Emergency Medicine

## 2015-03-13 ENCOUNTER — Emergency Department (HOSPITAL_BASED_OUTPATIENT_CLINIC_OR_DEPARTMENT_OTHER): Payer: Medicaid Other

## 2015-03-13 ENCOUNTER — Encounter (HOSPITAL_BASED_OUTPATIENT_CLINIC_OR_DEPARTMENT_OTHER): Payer: Self-pay

## 2015-03-13 DIAGNOSIS — J069 Acute upper respiratory infection, unspecified: Secondary | ICD-10-CM | POA: Insufficient documentation

## 2015-03-13 DIAGNOSIS — Z86018 Personal history of other benign neoplasm: Secondary | ICD-10-CM | POA: Insufficient documentation

## 2015-03-13 DIAGNOSIS — F17200 Nicotine dependence, unspecified, uncomplicated: Secondary | ICD-10-CM | POA: Insufficient documentation

## 2015-03-13 DIAGNOSIS — N390 Urinary tract infection, site not specified: Secondary | ICD-10-CM | POA: Insufficient documentation

## 2015-03-13 DIAGNOSIS — Z79899 Other long term (current) drug therapy: Secondary | ICD-10-CM | POA: Insufficient documentation

## 2015-03-13 DIAGNOSIS — Z8639 Personal history of other endocrine, nutritional and metabolic disease: Secondary | ICD-10-CM | POA: Insufficient documentation

## 2015-03-13 DIAGNOSIS — Z792 Long term (current) use of antibiotics: Secondary | ICD-10-CM | POA: Insufficient documentation

## 2015-03-13 DIAGNOSIS — D649 Anemia, unspecified: Secondary | ICD-10-CM | POA: Insufficient documentation

## 2015-03-13 LAB — URINE MICROSCOPIC-ADD ON

## 2015-03-13 LAB — URINALYSIS, ROUTINE W REFLEX MICROSCOPIC
BILIRUBIN URINE: NEGATIVE
Glucose, UA: NEGATIVE mg/dL
Ketones, ur: NEGATIVE mg/dL
Leukocytes, UA: NEGATIVE
Nitrite: NEGATIVE
PH: 5.5 (ref 5.0–8.0)
Protein, ur: 30 mg/dL — AB
SPECIFIC GRAVITY, URINE: 1.022 (ref 1.005–1.030)

## 2015-03-13 MED ORDER — BENZONATATE 100 MG PO CAPS: 100.0000 mg | ORAL_CAPSULE | Freq: Three times a day (TID) | ORAL | Status: AC | PRN

## 2015-03-13 MED ORDER — BENZONATATE 100 MG PO CAPS
100.0000 mg | ORAL_CAPSULE | Freq: Once | ORAL | Status: AC
  Administered 2015-03-13: 100 mg via ORAL
  Filled 2015-03-13: qty 1

## 2015-03-13 MED ORDER — CEPHALEXIN 500 MG PO CAPS: 500.0000 mg | ORAL_CAPSULE | Freq: Two times a day (BID) | ORAL | Status: AC

## 2015-03-13 MED ORDER — FLUCONAZOLE 150 MG PO TABS: 150.0000 mg | ORAL_TABLET | Freq: Every day | ORAL | Status: AC

## 2015-03-13 MED ORDER — FLUCONAZOLE 50 MG PO TABS: 150.0000 mg | ORAL_TABLET | Freq: Once | ORAL | Status: DC

## 2015-03-13 NOTE — ED Notes (Signed)
Pt transported to XR.  

## 2015-03-13 NOTE — ED Provider Notes (Signed)
CSN: [id number]     Arrival date & time 03/13/15  [id number] History   First MD Initiated Contact with Patient 03/13/15 0900     Chief Complaint  Patient presents with  . Cough     (Consider location/radiation/quality/duration/timing/severity/associated sxs/prior Treatment) Patient is a 50 y.o. female presenting with cough. The history is provided by the patient.  Cough Cough characteristics:  Productive Sputum characteristics:  Nondescript Severity:  Moderate Onset quality:  Gradual Timing:  Constant Progression:  Unchanged Chronicity:  New Context: sick contacts and weather changes   Relieved by: Mucinex and Tylenol. Worsened by:  Nothing tried Ineffective treatments:  None tried Associated symptoms: fever (subjective) and myalgias   Associated symptoms: no chest pain, no ear fullness, no ear pain, no rhinorrhea, no shortness of breath, no sinus congestion and no sore throat    Patient presents with cough x 3 days.  Associated symptoms include malaise, subjective fever, and generalized body aches.  She also states that she noticed hematuria yesterday.  Denies abdominal pain, N/V, back pain, or vaginal complaints.   Past Medical History  Diagnosis Date  . Fibroid   . Headache     migraines  . Vitamin D deficiency   . Anemia     iron deficiency anemia   Past Surgical History  Procedure Laterality Date  . Cesarean section      x 2  . Ablation  04/24/13    thermal ablation of endometrium (Thermochoice Balloon Ablation)  . Breast surgery Right     lumpectomy  . Dilation and curettage of uterus  01/16/13  . Uterine fibroid embolization     No family history on file. Social History  Substance Use Topics  . Smoking status: Current Some Day Smoker -- 0.20 packs/day for 2 years  . Smokeless tobacco: Never Used  . Alcohol Use: No   OB History    No data available     Review of Systems  Constitutional: Positive for fever (subjective) and fatigue.  HENT: Negative for  congestion, ear pain, rhinorrhea and sore throat.   Respiratory: Positive for cough. Negative for shortness of breath.   Cardiovascular: Negative for chest pain.  Genitourinary: Negative for dysuria, urgency, hematuria, vaginal bleeding, vaginal discharge, vaginal pain and pelvic pain.  Musculoskeletal: Positive for myalgias. Negative for neck pain and neck stiffness.  All other systems reviewed and are negative.     Allergies  Review of patient's allergies indicates no known allergies.  Home Medications   Prior to Admission medications   Medication Sig Start Date End Date Taking? Authorizing Provider  cephALEXin (KEFLEX) 500 MG capsule Take 1 capsule (500 mg total) by mouth 4 (four) times daily. 02/03/15   Kristen N Ward, DO  ferrous sulfate 325 (65 FE) MG tablet Take 1 tablet (325 mg total) by mouth daily. 07/30/12   Kaitlyn Szekalski, PA-C  fluconazole (DIFLUCAN) 150 MG tablet Take 1 tablet (150 mg total) by mouth once. 02/03/15   Kristen N Ward, DO  phenazopyridine (PYRIDIUM) 200 MG tablet Take 1 tablet (200 mg total) by mouth 3 (three) times daily. 02/03/15   Kristen N Ward, DO   BP 157/90 mmHg  Pulse 68  Temp(Src) 99 F (37.2 C) (Oral)  Resp 18  Ht 5\' 3"  (1.6 m)  Wt 58.968 kg  BMI 23.03 kg/m2  SpO2 98%  LMP 02/27/2015 Physical Exam  Constitutional: She is oriented to person, place, and time. She appears well-developed and well-nourished.  Non-toxic appearance. She does not have a  sickly appearance. She does not appear ill.  HENT:  Head: Normocephalic and atraumatic.  Right Ear: Tympanic membrane normal.  Left Ear: Tympanic membrane normal.  Nose: Nose normal.  Mouth/Throat: Uvula is midline, oropharynx is clear and moist and mucous membranes are normal. No trismus in the jaw. No oropharyngeal exudate, posterior oropharyngeal edema, posterior oropharyngeal erythema or tonsillar abscesses.  Eyes: Conjunctivae are normal. Pupils are equal, round, and reactive to light.  Neck:  Normal range of motion. Neck supple.  No nuchal rigidity.   Cardiovascular: Normal rate, regular rhythm and normal heart sounds.   No murmur heard. Pulmonary/Chest: Effort normal and breath sounds normal. No accessory muscle usage or stridor. No respiratory distress. She has no wheezes. She has no rhonchi. She has no rales.  Abdominal: Soft. Bowel sounds are normal. She exhibits no distension. There is no tenderness.  Musculoskeletal: Normal range of motion.  Lymphadenopathy:    She has no cervical adenopathy.  Neurological: She is alert and oriented to person, place, and time.  Speech clear without dysarthria.  Skin: Skin is warm and dry.  Psychiatric: She has a normal mood and affect. Her behavior is normal.    ED Course  Procedures (including critical care time) Labs Review Labs Reviewed  URINALYSIS, ROUTINE W REFLEX MICROSCOPIC (NOT AT [facility]) - Abnormal; Notable for the following:    APPearance CLOUDY (*)    Hgb urine dipstick MODERATE (*)    Protein, ur 30 (*)    All other components within normal limits  URINE MICROSCOPIC-ADD ON - Abnormal; Notable for the following:    Squamous Epithelial / LPF 0-5 (*)    Bacteria, UA MANY (*)    Crystals CA OXALATE CRYSTALS (*)    All other components within normal limits  URINE CULTURE    Imaging Review Dg Chest 2 View  03/13/2015  CLINICAL DATA:  Cough and congestion for the past 3-4 days. EXAM: CHEST  2 VIEW COMPARISON:  None. FINDINGS: Borderline enlarged cardiac silhouette and mediastinal contours. No focal airspace opacities. There is minimal pleural parenchymal thickening about the bilateral major and the right minor fissures. No evidence of edema. No pleural effusion or pneumothorax. No acute osseus abnormalities. IMPRESSION: Borderline cardiomegaly without acute cardiopulmonary disease. Specifically, no focal airspace opacities to suggest pneumonia. Electronically Signed   By: Sandi Mariscal M.D.   On: 03/13/2015 09:39   I have  personally reviewed and evaluated these images and lab results as part of my medical decision-making.   EKG Interpretation None      MDM   Final diagnoses:  URI (upper respiratory infection)  UTI (lower urinary tract infection)    Likely viral URI.  VSS, NAD.  Lungs CTAB, heart RRR, abdomen soft and benign.  HENT exam unremarkable.  CXR to evaluate for PNA.  UA to r/o infection, patient complains of hematuria, denies vaginal pain/complaints, dysuria, increased urinary frequency.  UA with hematuria and bacteria.  Will obtain urine culture and discharge home with keflex.  Follow up PCP.  Discussed return precautions.  Patient agrees and acknowledges the above plan for discharge.     Gloriann Loan, PA-C 03/13/15 [id number]  Tanna Furry, MD 03/22/15 [phone]

## 2015-03-13 NOTE — ED Notes (Signed)
Pt reports generalized fatigue and cough x 3 days. Reports non productive cough.

## 2015-03-13 NOTE — ED Notes (Signed)
Pt. returned from XR. 

## 2015-03-13 NOTE — ED Notes (Signed)
PA-C at bedside 

## 2015-03-13 NOTE — Discharge Instructions (Signed)
Upper Respiratory Infection, Adult Most upper respiratory infections (URIs) are a viral infection of the air passages leading to the lungs. A URI affects the nose, throat, and upper air passages. The most common type of URI is nasopharyngitis and is typically referred to as "the common cold." URIs run their course and usually go away on their own. Most of the time, a URI does not require medical attention, but sometimes a bacterial infection in the upper airways can follow a viral infection. This is called a secondary infection. Sinus and middle ear infections are common types of secondary upper respiratory infections. Bacterial pneumonia can also complicate a URI. A URI can worsen asthma and chronic obstructive pulmonary disease (COPD). Sometimes, these complications can require emergency medical care and may be life threatening.  CAUSES Almost all URIs are caused by viruses. A virus is a type of germ and can spread from one person to another.  RISKS FACTORS You may be at risk for a URI if:   You smoke.   You have chronic heart or lung disease.  You have a weakened defense (immune) system.   You are very young or very old.   You have nasal allergies or asthma.  You work in crowded or poorly ventilated areas.  You work in health care facilities or schools. SIGNS AND SYMPTOMS  Symptoms typically develop 2-3 days after you come in contact with a cold virus. Most viral URIs last 7-10 days. However, viral URIs from the influenza virus (flu virus) can last 14-18 days and are typically more severe. Symptoms may include:   Runny or stuffy (congested) nose.   Sneezing.   Cough.   Sore throat.   Headache.   Fatigue.   Fever.   Loss of appetite.   Pain in your forehead, behind your eyes, and over your cheekbones (sinus pain).  Muscle aches.  DIAGNOSIS  Your health care provider may diagnose a URI by:  Physical exam.  Tests to check that your symptoms are not due to  another condition such as:  Strep throat.  Sinusitis.  Pneumonia.  Asthma. TREATMENT  A URI goes away on its own with time. It cannot be cured with medicines, but medicines may be prescribed or recommended to relieve symptoms. Medicines may help:  Reduce your fever.  Reduce your cough.  Relieve nasal congestion. HOME CARE INSTRUCTIONS   Take medicines only as directed by your health care provider.   Gargle warm saltwater or take cough drops to comfort your throat as directed by your health care provider.  Use a warm mist humidifier or inhale steam from a shower to increase air moisture. This may make it easier to breathe.  Drink enough fluid to keep your urine clear or pale yellow.   Eat soups and other clear broths and maintain good nutrition.   Rest as needed.   Return to work when your temperature has returned to normal or as your health care provider advises. You may need to stay home longer to avoid infecting others. You can also use a face mask and careful hand washing to prevent spread of the virus.  Increase the usage of your inhaler if you have asthma.   Do not use any tobacco products, including cigarettes, chewing tobacco, or electronic cigarettes. If you need help quitting, ask your health care provider. PREVENTION  The best way to protect yourself from getting a cold is to practice good hygiene.   Avoid oral or hand contact with people with cold  symptoms.   Wash your hands often if contact occurs.  There is no clear evidence that vitamin C, vitamin E, echinacea, or exercise reduces the chance of developing a cold. However, it is always recommended to get plenty of rest, exercise, and practice good nutrition.  SEEK MEDICAL CARE IF:   You are getting worse rather than better.   Your symptoms are not controlled by medicine.   You have chills.  You have worsening shortness of breath.  You have brown or red mucus.  You have yellow or brown nasal  discharge.  You have pain in your face, especially when you bend forward.  You have a fever.  You have swollen neck glands.  You have pain while swallowing.  You have white areas in the back of your throat. SEEK IMMEDIATE MEDICAL CARE IF:   You have severe or persistent:  Headache.  Ear pain.  Sinus pain.  Chest pain.  You have chronic lung disease and any of the following:  Wheezing.  Prolonged cough.  Coughing up blood.  A change in your usual mucus.  You have a stiff neck.  You have changes in your:  Vision.  Hearing.  Thinking.  Mood. MAKE SURE YOU:   Understand these instructions.  Will watch your condition.  Will get help right away if you are not doing well or get worse.   This information is not intended to replace advice given to you by your health care provider. Make sure you discuss any questions you have with your health care provider.   Document Released: 06/21/2000 Document Revised: 05/12/2014 Document Reviewed: 04/02/2013 Elsevier Interactive Patient Education 2016 [employer]  Urinary Tract Infection Urinary tract infections (UTIs) can develop anywhere along your urinary tract. Your urinary tract is your body's drainage system for removing wastes and extra water. Your urinary tract includes two kidneys, two ureters, a bladder, and a urethra. Your kidneys are a pair of bean-shaped organs. Each kidney is about the size of your fist. They are located below your ribs, one on each side of your spine. CAUSES Infections are caused by microbes, which are microscopic organisms, including fungi, viruses, and bacteria. These organisms are so small that they can only be seen through a microscope. Bacteria are the microbes that most commonly cause UTIs. SYMPTOMS  Symptoms of UTIs may vary by age and gender of the patient and by the location of the infection. Symptoms in young women typically include a frequent and intense urge to urinate and a  painful, burning feeling in the bladder or urethra during urination. Older women and men are more likely to be tired, shaky, and weak and have muscle aches and abdominal pain. A fever may mean the infection is in your kidneys. Other symptoms of a kidney infection include pain in your back or sides below the ribs, nausea, and vomiting. DIAGNOSIS To diagnose a UTI, your caregiver will ask you about your symptoms. Your caregiver will also ask you to provide a urine sample. The urine sample will be tested for bacteria and white blood cells. White blood cells are made by your body to help fight infection. TREATMENT  Typically, UTIs can be treated with medication. Because most UTIs are caused by a bacterial infection, they usually can be treated with the use of antibiotics. The choice of antibiotic and length of treatment depend on your symptoms and the type of bacteria causing your infection. HOME CARE INSTRUCTIONS  If you were prescribed antibiotics, take them exactly as your  caregiver instructs you. Finish the medication even if you feel better after you have only taken some of the medication.  Drink enough water and fluids to keep your urine clear or pale yellow.  Avoid caffeine, tea, and carbonated beverages. They tend to irritate your bladder.  Empty your bladder often. Avoid holding urine for long periods of time.  Empty your bladder before and after sexual intercourse.  After a bowel movement, women should cleanse from front to back. Use each tissue only once. SEEK MEDICAL CARE IF:   You have back pain.  You develop a fever.  Your symptoms do not begin to resolve within 3 days. SEEK IMMEDIATE MEDICAL CARE IF:   You have severe back pain or lower abdominal pain.  You develop chills.  You have nausea or vomiting.  You have continued burning or discomfort with urination. MAKE SURE YOU:   Understand these instructions.  Will watch your condition.  Will get help right away if you  are not doing well or get worse.   This information is not intended to replace advice given to you by your health care provider. Make sure you discuss any questions you have with your health care provider.   Document Released: 10/05/2004 Document Revised: 09/16/2014 Document Reviewed: 02/03/2011 Elsevier Interactive Patient Education [employer].

## 2015-03-15 LAB — URINE CULTURE

## 2015-06-25 IMAGING — CT CT ABD-PELV W/O CM
2 of 4 series · 16 of 46 positions shown, 18 images · non-contrast
Comparison: Pelvic ultrasound July 30, 2012.

CLINICAL DATA: Right flank pain, assess for renal stone.

EXAM:
CT ABDOMEN AND PELVIS WITHOUT CONTRAST
TECHNIQUE: Multidetector CT imaging of the abdomen and pelvis was performed
following the standard protocol without IV contrast.

[Series 2: renal stone > 200 lbs 5.0 b31f · axial · 0.79mm/px · z∈[+810,+1225]mm · 13 of 93 slices shown, 15 images]
[im 5/93  soft-tissue]
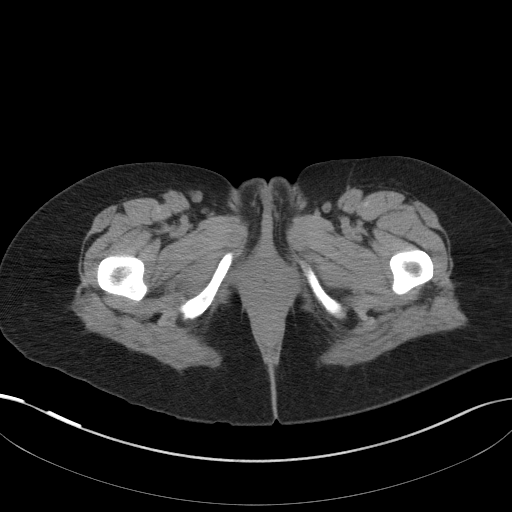
[im 5/93  bone]
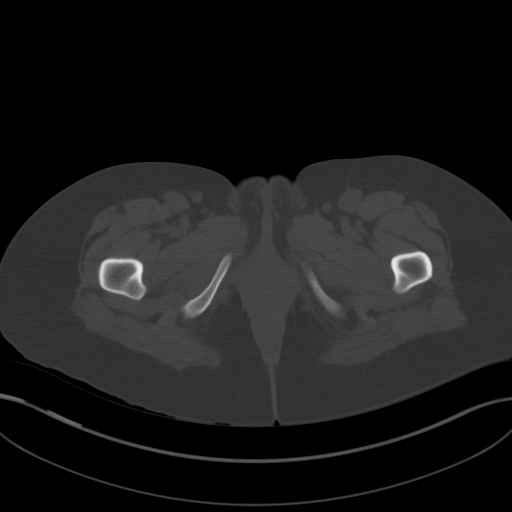
[im 13/93  soft-tissue]
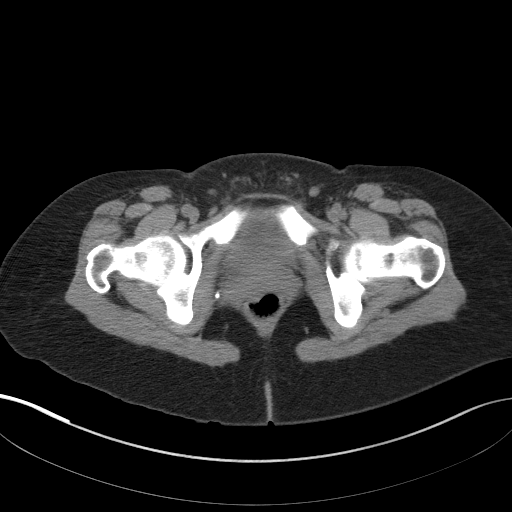
[im 21/93  soft-tissue]
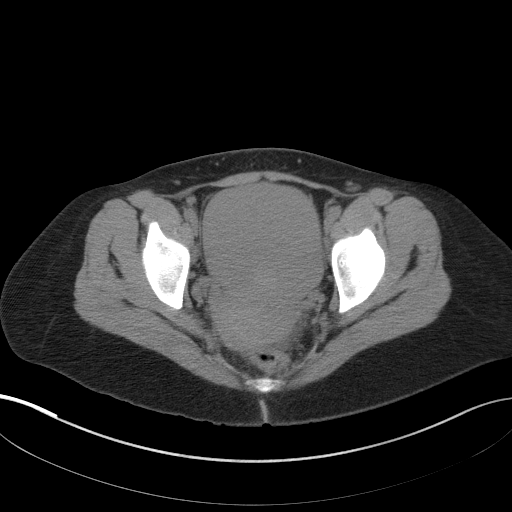
[im 26/93  soft-tissue]
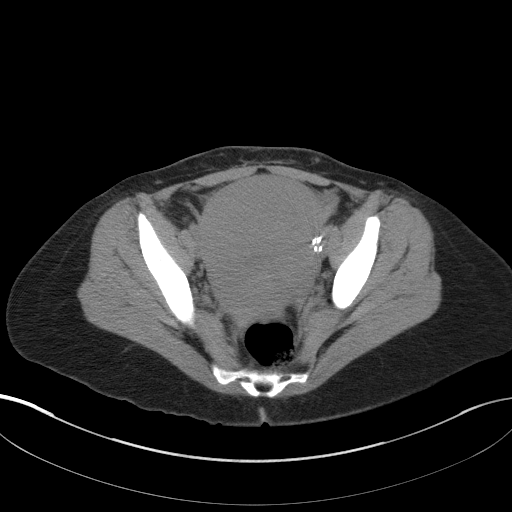
[im 34/93  soft-tissue]
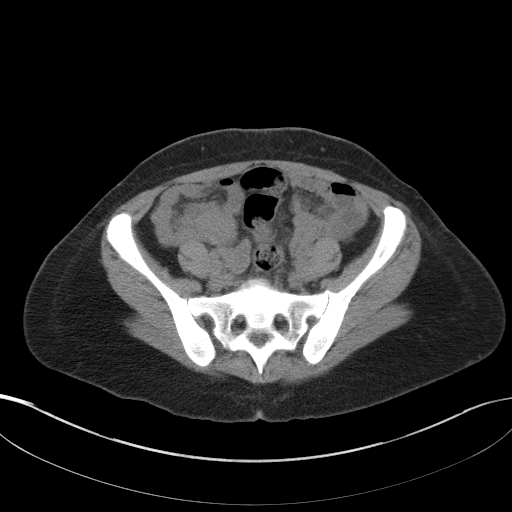
[im 38/93  soft-tissue]
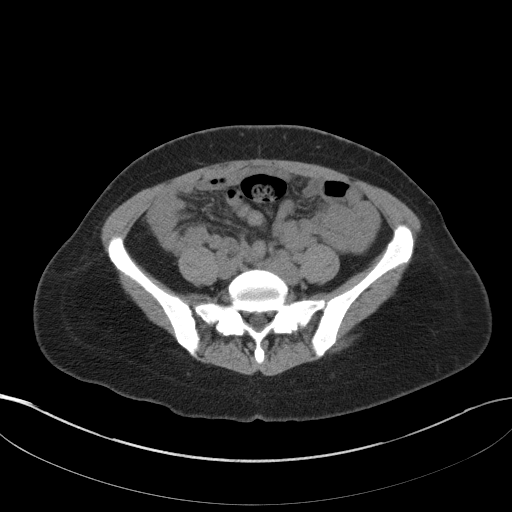
[im 47/93  soft-tissue]
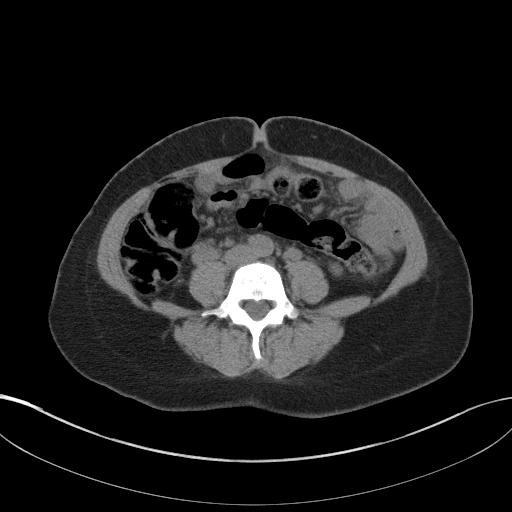
[im 55/93  soft-tissue]
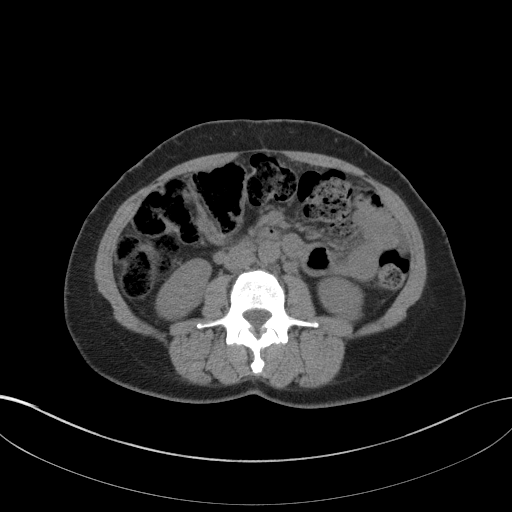
[im 59/93  soft-tissue]
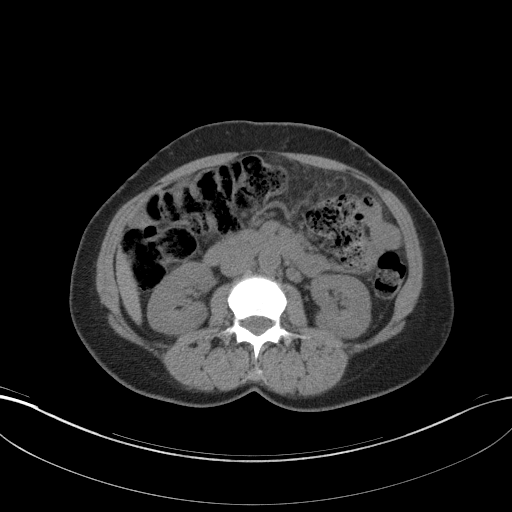
[im 59/93  bone]
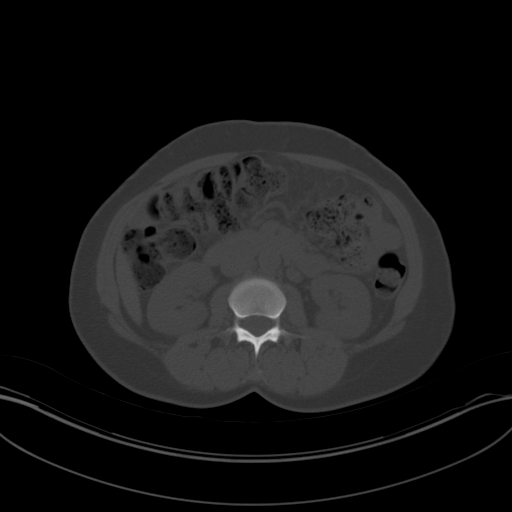
[im 67/93  soft-tissue]
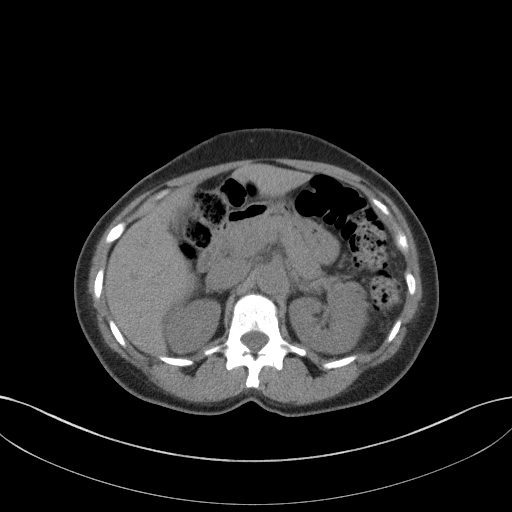
[im 72/93  soft-tissue]
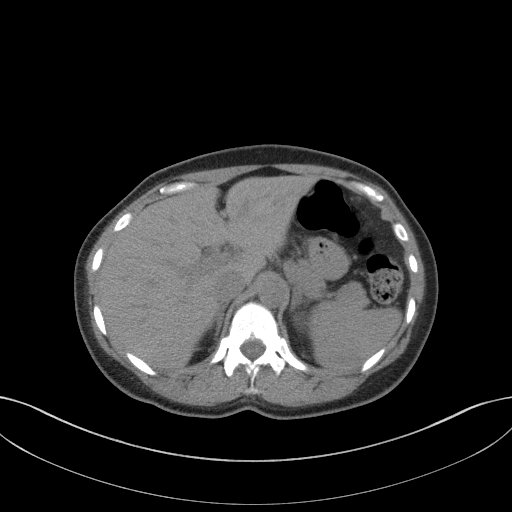
[im 80/93  soft-tissue]
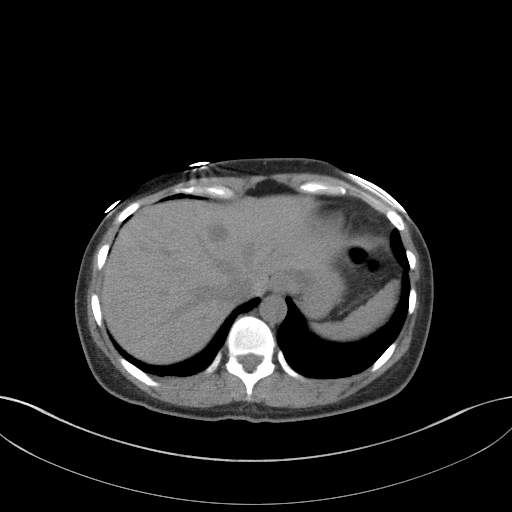
[im 88/93  soft-tissue]
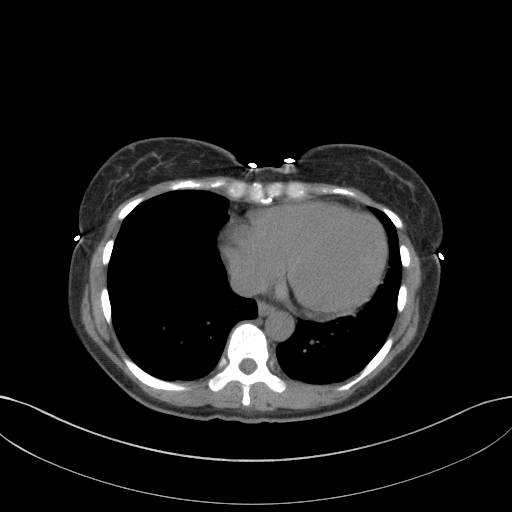

[Series 5: renal stone 3.0 coronal · coronal · 0.76mm/px · 3 of 79 slices shown]
[im 27/79  soft-tissue]
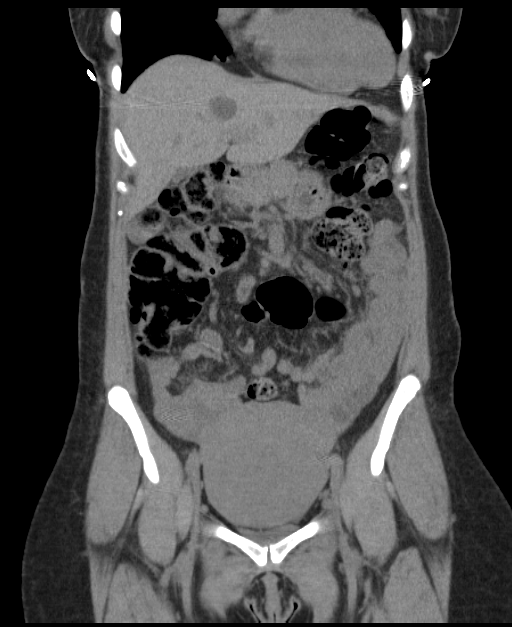
[im 35/79  soft-tissue]
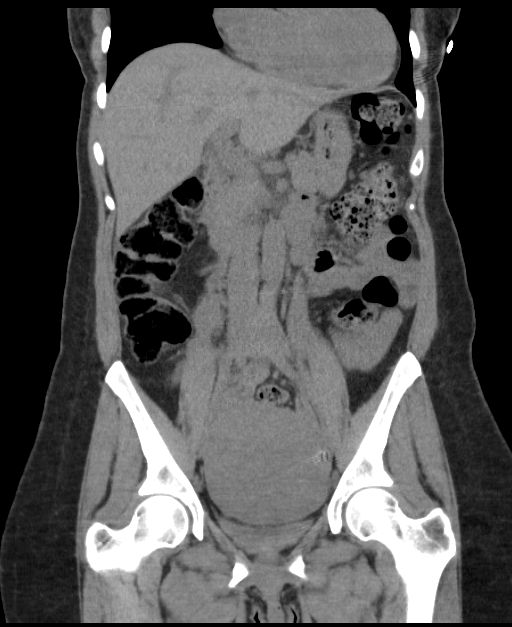
[im 44/79  soft-tissue]
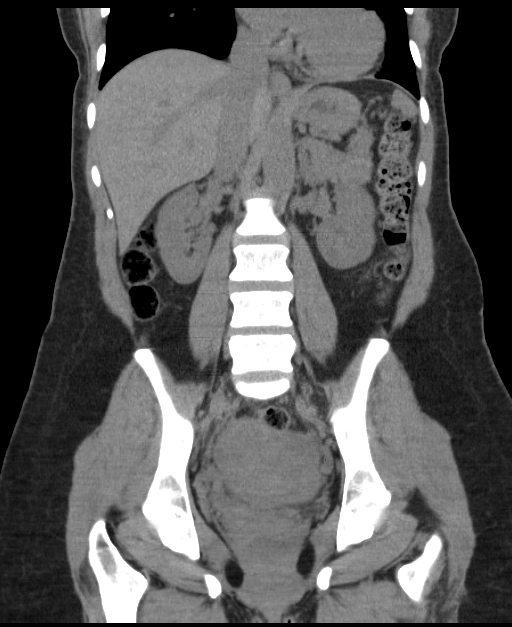

[16 of 46 positions shown; findings below may reference images not displayed]

FINDINGS: LUNG BASES: Included view of the lung bases are clear. The heart
appears mildly enlarged, pericardium is unremarkable.

KIDNEYS/BLADDER: Kidneys are orthotopic, demonstrating normal size
and morphology. No nephrolithiasis, hydronephrosis; limited
assessment for renal masses on this nonenhanced examination. The
unopacified ureters are normal in course and caliber. Urinary
bladder is decompressed and unremarkable.

SOLID ORGANS: The spleen, pancreas and adrenal glands are
unremarkable for this non-contrast examination. 2.6 cm cyst in left
lobe of the liver, the liver is otherwise unremarkable. Gallbladder
is not visualized, likely surgically absent. Apparent dropped clip
in the right pelvis.

GASTROINTESTINAL TRACT: The stomach, small and large bowel are
normal in course and caliber without inflammatory changes, the
sensitivity may be decreased by lack of enteric contrast. Moderate
amount of retained large bowel stool. The appendix is not discretely
identified, however there are no inflammatory changes in the right
lower quadrant.

PERITONEUM/RETROPERITONEUM: No intraperitoneal free fluid nor free
air. Aortoiliac vessels are normal in course and caliber. No
lymphadenopathy by CT size criteria. Enlarged heterogeneous uterus,
as seen on prior imaging.

SOFT TISSUES/ OSSEOUS STRUCTURES: The soft tissues and included
osseous structures are nonsuspicious.
IMPRESSION: No urolithiasis nor acute intra-abdominal or pelvic process.
Moderate amount of retained large bowel stool.

Enlarged uterus, prior pelvic sonogram reported multiple
leiomyomata.

  By: Mederic Pailhes

## 2016-01-12 ENCOUNTER — Encounter: Payer: Self-pay | Admitting: Interventional Radiology

## 2017-01-30 ENCOUNTER — Encounter (HOSPITAL_BASED_OUTPATIENT_CLINIC_OR_DEPARTMENT_OTHER): Payer: Self-pay

## 2017-01-30 ENCOUNTER — Other Ambulatory Visit: Payer: Self-pay

## 2017-01-30 ENCOUNTER — Emergency Department (HOSPITAL_BASED_OUTPATIENT_CLINIC_OR_DEPARTMENT_OTHER)
Admission: EM | Admit: 2017-01-30 | Discharge: 2017-01-30 | Disposition: A | Discharge status: Home / Self Care | Payer: BLUE CROSS/BLUE SHIELD | Attending: Emergency Medicine | Admitting: Emergency Medicine

## 2017-01-30 DIAGNOSIS — F17210 Nicotine dependence, cigarettes, uncomplicated: Secondary | ICD-10-CM | POA: Diagnosis not present

## 2017-01-30 DIAGNOSIS — Z79899 Other long term (current) drug therapy: Secondary | ICD-10-CM | POA: Diagnosis not present

## 2017-01-30 DIAGNOSIS — Z3202 Encounter for pregnancy test, result negative: Secondary | ICD-10-CM | POA: Diagnosis not present

## 2017-01-30 DIAGNOSIS — E876 Hypokalemia: Secondary | ICD-10-CM | POA: Insufficient documentation

## 2017-01-30 DIAGNOSIS — I10 Essential (primary) hypertension: Secondary | ICD-10-CM | POA: Insufficient documentation

## 2017-01-30 DIAGNOSIS — R51 Headache: Secondary | ICD-10-CM | POA: Diagnosis present

## 2017-01-30 HISTORY — DX: Essential (primary) hypertension: I10

## 2017-01-30 LAB — BASIC METABOLIC PANEL
Anion gap: 8 (ref 5–15)
BUN: 17 mg/dL (ref 6–20)
CALCIUM: 9 mg/dL (ref 8.9–10.3)
CO2: 28 mmol/L (ref 22–32)
Chloride: 102 mmol/L (ref 101–111)
Creatinine, Ser: 0.64 mg/dL (ref 0.44–1.00)
GFR calc Af Amer: >60 mL/min (ref >60)
GLUCOSE: 128 mg/dL — AB (ref 65–99)
Potassium: 3 mmol/L — ABNORMAL LOW (ref 3.5–5.1)
Sodium: 138 mmol/L (ref 135–145)

## 2017-01-30 LAB — URINALYSIS, ROUTINE W REFLEX MICROSCOPIC
Bilirubin Urine: NEGATIVE
Glucose, UA: NEGATIVE mg/dL
Ketones, ur: NEGATIVE mg/dL
NITRITE: NEGATIVE
Protein, ur: NEGATIVE mg/dL
pH: 5.5 (ref 5.0–8.0)

## 2017-01-30 LAB — URINALYSIS, MICROSCOPIC (REFLEX)

## 2017-01-30 LAB — PREGNANCY, URINE: Preg Test, Ur: NEGATIVE

## 2017-01-30 MED ORDER — POTASSIUM CHLORIDE CRYS ER 20 MEQ PO TBCR
40.0000 meq | EXTENDED_RELEASE_TABLET | Freq: Once | ORAL | Status: AC
  Administered 2017-01-30: 40 meq via ORAL
  Filled 2017-01-30: qty 2

## 2017-01-30 MED ORDER — POTASSIUM CHLORIDE ER 10 MEQ PO TBCR: 10.0000 meq | EXTENDED_RELEASE_TABLET | Freq: Two times a day (BID) | ORAL | 0 refills | Status: AC

## 2017-01-30 NOTE — Discharge Instructions (Signed)
Your potassium is low.  This is likely from the vomiting, as well as your new diuretic medication for your blood pressure. You can stop the antibiotic.  Your urine does not appear infected. Push fluids to stay hydrated. Call your primary care physician for repeat evaluation.

## 2017-01-30 NOTE — ED Notes (Signed)
ED Provider at bedside. 

## 2017-01-30 NOTE — ED Notes (Signed)
Patient c/o headache to left forehead.  Per patient, her neck felt like it is pulsating before arrival to her room.  She also verbalized that her left eye also felt like it is pulsating.  While sitting in the waiting, patient stated that she was feeling dizzy.

## 2017-01-30 NOTE — ED Triage Notes (Signed)
Pt states she has had a headache since this morning, was put on BP medication last week and feels like it is not working, tried [facility] (consulting) today but they were closed, also c/o bilateral lower back pain, has been taking BC powder without relief, last dose was yesterday

## 2017-01-30 NOTE — ED Provider Notes (Signed)
[facility] DEPART[facility] Provider Note   CSN: [id number] Arrival date & time: 01/30/17  1814     History   Chief Complaint Chief Complaint  Patient presents with  . Headache    HPI Sara Travis is a 52 y.o. female.  Chief complaint is headache, vomiting, new medication questions  HPI 52 year old female.  Seen by primary care physician recently for hypertension as well as UTI.  She states when she takes the antibiotics they make her throw up.  She is no longer having dysuria.  She started on hydrochlorothiazide a week ago.  She has a headache when she stands up and feels dizzy at times.  Past Medical History:  Diagnosis Date  . Anemia    iron deficiency anemia  . Fibroid   . Headache    migraines  . Hypertension   . Vitamin D deficiency     There are no active problems to display for this patient.   Past Surgical History:  Procedure Laterality Date  . ABLATION  04/24/13   thermal ablation of endometrium (Thermochoice Balloon Ablation)  . BREAST SURGERY Right    lumpectomy  . CESAREAN SECTION     x 2  . DILATION AND CURETTAGE OF UTERUS  01/16/13  . IR GENERIC HISTORICAL  02/04/2014   IR RADIOLOGIST EVAL & MGMT 02/04/2014 Corrie Mckusick, DO [facility] INTERV RAD  . UTERINE FIBROID EMBOLIZATION      OB History    No data available       Home Medications    Prior to Admission medications   Medication Sig Start Date End Date Taking? Authorizing Provider  benzonatate (TESSALON) 100 MG capsule Take 1 capsule (100 mg total) by mouth 3 (three) times daily as needed for cough. 03/13/15   Gloriann Loan, PA-C  cephALEXin (KEFLEX) 500 MG capsule Take 1 capsule (500 mg total) by mouth 2 (two) times daily. 03/13/15   Gloriann Loan, PA-C  ferrous sulfate 325 (65 FE) MG tablet Take 1 tablet (325 mg total) by mouth daily. 07/30/12   Alvina Chou, PA-C  phenazopyridine (PYRIDIUM) 200 MG tablet Take 1 tablet (200 mg total) by mouth 3 (three) times daily. 02/03/15    Ward, Delice Bison, DO  potassium chloride (K-DUR) 10 MEQ tablet Take 1 tablet (10 mEq total) by mouth 2 (two) times daily. 01/30/17   Tanna Furry, MD    Family History No family history on file.  Social History Social History   Tobacco Use  . Smoking status: Current Some Day Smoker    Packs/day: 0.20    Years: 2.00    Pack years: 0.40  . Smokeless tobacco: Never Used  Substance Use Topics  . Alcohol use: No  . Drug use: No     Allergies   Patient has no known allergies.   Review of Systems Review of Systems  Constitutional: Negative for appetite change, chills, diaphoresis, fatigue and fever.  HENT: Negative for mouth sores, sore throat and trouble swallowing.   Eyes: Negative for visual disturbance.  Respiratory: Negative for cough, chest tightness, shortness of breath and wheezing.   Cardiovascular: Negative for chest pain.  Gastrointestinal: Positive for nausea. Negative for abdominal distention, abdominal pain, diarrhea and vomiting.  Endocrine: Negative for polydipsia, polyphagia and polyuria.  Genitourinary: Negative for dysuria, frequency and hematuria.  Musculoskeletal: Negative for gait problem.  Skin: Negative for color change, pallor and rash.  Neurological: Positive for dizziness and headaches. Negative for syncope and light-headedness.  Hematological: Does not bruise/bleed  easily.  Psychiatric/Behavioral: Negative for behavioral problems and confusion.     Physical Exam Updated Vital Signs BP (!) 146/91 (BP Location: Right Arm)   Pulse (!) 56   Temp 98.2 F (36.8 C) (Oral)   Resp 17   Ht 5\' 4"  (1.626 m)   Wt 63.5 kg (140 lb)   LMP 12/24/2016   SpO2 98%   BMI 24.03 kg/m   Physical Exam  Constitutional: She is oriented to person, place, and time. She appears well-developed and well-nourished. No distress.  HENT:  Head: Normocephalic.  Eyes: Conjunctivae are normal. Pupils are equal, round, and reactive to light. No scleral icterus.  Neck: Normal  range of motion. Neck supple. No thyromegaly present.  Cardiovascular: Normal rate and regular rhythm. Exam reveals no gallop and no friction rub.  No murmur heard. Pulmonary/Chest: Effort normal and breath sounds normal. No respiratory distress. She has no wheezes. She has no rales.  Abdominal: Soft. Bowel sounds are normal. She exhibits no distension. There is no tenderness. There is no rebound.  Musculoskeletal: Normal range of motion.  Neurological: She is alert and oriented to person, place, and time.  Skin: Skin is warm and dry. No rash noted.  Psychiatric: She has a normal mood and affect. Her behavior is normal.     ED Treatments / Results  Labs (all labs ordered are listed, but only abnormal results are displayed) Labs Reviewed  URINALYSIS, ROUTINE W REFLEX MICROSCOPIC - Abnormal; Notable for the following components:      Result Value   Specific Gravity, Urine >1.030 (*)    Hgb urine dipstick TRACE (*)    Leukocytes, UA TRACE (*)    All other components within normal limits  URINALYSIS, MICROSCOPIC (REFLEX) - Abnormal; Notable for the following components:   Bacteria, UA RARE (*)    Squamous Epithelial / LPF 0-5 (*)    All other components within normal limits  BASIC METABOLIC PANEL - Abnormal; Notable for the following components:   Potassium 3.0 (*)    Glucose, Bld 128 (*)    All other components within normal limits  PREGNANCY, URINE    EKG  EKG Interpretation None       Radiology No results found.  Procedures Procedures (including critical care time)  Medications Ordered in ED Medications  potassium chloride SA (K-DUR,KLOR-CON) CR tablet 40 mEq (not administered)     Initial Impression / Assessment and Plan / ED Course  I have reviewed the triage vital signs and the nursing notes.  Pertinent labs & imaging results that were available during my care of the patient were reviewed by me and considered in my medical decision making (see chart for  details).   Hypokalemic.  Does not appear clinically dehydrated.  Her urine is concentrated, but does not appear infected.  Potassium 3.0.  She is taking p.o. without difficulty.  Of asked her to stop her antibiotic as her urine appears to no longer be infected.  Follow-up test of cure culture pending.  Plan will be potassium replacement.  Primary care follow-up regarding her orthostasis and hypokalemia secondary to her thiazide.    Final Clinical Impressions(s) / ED Diagnoses   Final diagnoses:  Hypokalemia  Hypertension, unspecified type    ED Discharge Orders        Ordered    potassium chloride (K-DUR) 10 MEQ tablet  2 times daily     01/30/17 2144       Tanna Furry, MD 01/30/17 2147

## 2017-04-22 ENCOUNTER — Other Ambulatory Visit: Payer: Self-pay

## 2017-04-22 ENCOUNTER — Encounter (HOSPITAL_BASED_OUTPATIENT_CLINIC_OR_DEPARTMENT_OTHER): Payer: Self-pay | Admitting: *Deleted

## 2017-04-22 ENCOUNTER — Emergency Department (HOSPITAL_BASED_OUTPATIENT_CLINIC_OR_DEPARTMENT_OTHER)
Admission: EM | Admit: 2017-04-22 | Discharge: 2017-04-23 | Disposition: A | Discharge status: Home / Self Care | Payer: BLUE CROSS/BLUE SHIELD | Attending: Emergency Medicine | Admitting: Emergency Medicine

## 2017-04-22 DIAGNOSIS — I10 Essential (primary) hypertension: Secondary | ICD-10-CM | POA: Insufficient documentation

## 2017-04-22 DIAGNOSIS — F17200 Nicotine dependence, unspecified, uncomplicated: Secondary | ICD-10-CM | POA: Insufficient documentation

## 2017-04-22 DIAGNOSIS — R42 Dizziness and giddiness: Secondary | ICD-10-CM

## 2017-04-22 DIAGNOSIS — Z79899 Other long term (current) drug therapy: Secondary | ICD-10-CM | POA: Diagnosis not present

## 2017-04-22 MED ORDER — ONDANSETRON 4 MG PO TBDP
4.0000 mg | ORAL_TABLET | Freq: Once | ORAL | Status: AC
  Administered 2017-04-22: 4 mg via ORAL
  Filled 2017-04-22: qty 1

## 2017-04-22 MED ORDER — MECLIZINE HCL 25 MG PO TABS
25.0000 mg | ORAL_TABLET | Freq: Once | ORAL | Status: AC
  Administered 2017-04-22: 25 mg via ORAL
  Filled 2017-04-22: qty 1

## 2017-04-22 NOTE — ED Triage Notes (Signed)
Pt reports nausea and 'head spinning' that started 30 minutes PTA.

## 2017-04-23 ENCOUNTER — Other Ambulatory Visit: Payer: Self-pay

## 2017-04-23 MED ORDER — MECLIZINE HCL 25 MG PO TABS: 25.0000 mg | ORAL_TABLET | Freq: Three times a day (TID) | ORAL | 0 refills | Status: AC | PRN

## 2017-04-23 MED ORDER — ONDANSETRON 4 MG PO TBDP: 4.0000 mg | ORAL_TABLET | Freq: Three times a day (TID) | ORAL | 0 refills | Status: AC | PRN

## 2017-04-23 NOTE — ED Provider Notes (Signed)
[facility] EMERGENCY DEPARTMENT Provider Note   CSN: [id number] Arrival date & time: 04/22/17  2145     History   Chief Complaint Chief Complaint  Patient presents with  . Nausea    HPI Sara Travis is a 52 y.o. female.  HPI  This is a 52 year old female with no significant past medical history who presents with dizziness and vomiting.  Patient reports onset of symptoms after eating dinner.  She reports room spinning dizziness and lightheadedness.  She reports worsening of symptoms with position changes.  She reports multiple episodes of nonbilious, nonbloody emesis.  No history of the same.  Denies any recent illnesses, upper respiratory symptoms, fever.  Denies any speech difficulty, weakness, numbness, tingling or strokelike symptoms.  Patient denies any abdominal pain, chest pain, or shortness of breath.  Past Medical History:  Diagnosis Date  . Anemia    iron deficiency anemia  . Fibroid   . Headache    migraines  . Hypertension   . Vitamin D deficiency     There are no active problems to display for this patient.   Past Surgical History:  Procedure Laterality Date  . ABLATION  04/24/13   thermal ablation of endometrium (Thermochoice Balloon Ablation)  . BREAST SURGERY Right    lumpectomy  . CESAREAN SECTION     x 2  . DILATION AND CURETTAGE OF UTERUS  01/16/13  . IR GENERIC HISTORICAL  02/04/2014   IR RADIOLOGIST EVAL & MGMT 02/04/2014 Corrie Mckusick, DO [facility] INTERV RAD  . UTERINE FIBROID EMBOLIZATION       OB History   None      Home Medications    Prior to Admission medications   Medication Sig Start Date End Date Taking? Authorizing Provider  benzonatate (TESSALON) 100 MG capsule Take 1 capsule (100 mg total) by mouth 3 (three) times daily as needed for cough. 03/13/15   Gloriann Loan, PA-C  cephALEXin (KEFLEX) 500 MG capsule Take 1 capsule (500 mg total) by mouth 2 (two) times daily. 03/13/15   Gloriann Loan, PA-C  ferrous sulfate 325 (65 FE)  MG tablet Take 1 tablet (325 mg total) by mouth daily. 07/30/12   Szekalski, Kaitlyn, PA-C  meclizine (ANTIVERT) 25 MG tablet Take 1 tablet (25 mg total) by mouth 3 (three) times daily as needed for dizziness. 04/23/17   Ander Wamser, Barbette Hair, MD  ondansetron (ZOFRAN ODT) 4 MG disintegrating tablet Take 1 tablet (4 mg total) by mouth every 8 (eight) hours as needed for nausea or vomiting. 04/23/17   Tasman Zapata, Barbette Hair, MD  phenazopyridine (PYRIDIUM) 200 MG tablet Take 1 tablet (200 mg total) by mouth 3 (three) times daily. 02/03/15   Ward, Delice Bison, DO  potassium chloride (K-DUR) 10 MEQ tablet Take 1 tablet (10 mEq total) by mouth 2 (two) times daily. 01/30/17   Tanna Furry, MD    Family History History reviewed. No pertinent family history.  Social History Social History   Tobacco Use  . Smoking status: Current Some Day Smoker    Packs/day: 0.20    Years: 2.00    Pack years: 0.40  . Smokeless tobacco: Never Used  Substance Use Topics  . Alcohol use: No  . Drug use: No     Allergies   Patient has no known allergies.   Review of Systems Review of Systems  Constitutional: Negative for fever.  Respiratory: Negative for shortness of breath.   Cardiovascular: Negative for chest pain.  Gastrointestinal: Positive for nausea and  vomiting. Negative for abdominal pain.  Musculoskeletal: Negative for back pain and neck pain.  Neurological: Positive for dizziness and light-headedness. Negative for weakness and numbness.  All other systems reviewed and are negative.    Physical Exam Updated Vital Signs BP (!) 167/88 (BP Location: Left Arm)   Pulse 68   Temp 98.6 F (37 C) (Oral)   Resp 18   Ht 5\' 5"  (1.651 m)   Wt 59 kg (130 lb)   SpO2 100%   BMI 21.63 kg/m   Physical Exam  Constitutional: She is oriented to person, place, and time. She appears well-developed and well-nourished. No distress.  HENT:  Head: Normocephalic and atraumatic.  Eyes: Pupils are equal, round, and reactive  to light.  Horizontal nystagmus noted, pupils 3 mm reactive bilaterally  Cardiovascular: Normal rate, regular rhythm and normal heart sounds.  Pulmonary/Chest: Effort normal and breath sounds normal. No respiratory distress. She has no wheezes.  Abdominal: Soft. There is no tenderness.  Neurological: She is alert and oriented to person, place, and time.  Cranial nerves II through XII intact, 5 out of 5 strength in all 4 extremities, no dysmetria to finger-nose-finger  Skin: Skin is warm and dry.  Psychiatric: She has a normal mood and affect.  Nursing note and vitals reviewed.    ED Treatments / Results  Labs (all labs ordered are listed, but only abnormal results are displayed) Labs Reviewed - No data to display  EKG EKG Interpretation  Date/Time:  Sunday April 22 2017 22:04:02 EDT Ventricular Rate:  58 PR Interval:    QRS Duration: 74 QT Interval:  424 QTC Calculation: 416 R Axis:   74 Text Interpretation:  Normal sinus rhythm Septal infarct , age undetermined Abnormal ECG Confirmed by Lisette Mancebo ([id number]) on 04/22/2017 11:19:34 PM   Radiology No results found.  Procedures Procedures (including critical care time)  Medications Ordered in ED Medications  ondansetron (ZOFRAN-ODT) disintegrating tablet 4 mg (4 mg Oral Given 04/22/17 2353)  meclizine (ANTIVERT) tablet 25 mg (25 mg Oral Given 04/22/17 2353)     Initial Impression / Assessment and Plan / ED Course  I have reviewed the triage vital signs and the nursing notes.  Pertinent labs & imaging results that were available during my care of the patient were reviewed by me and considered in my medical decision making (see chart for details).     Shunt presents with dizziness and vomiting.  She is overall nontoxic appearing on exam.  Vital signs notable for blood pressure of 167/88.  She is neurologically intact.  No evidence of cerebellar dysfunction to suggest stroke.  Symptoms most consistent with peripheral  vertigo.  Patient was given meclizine and Zofran.  On recheck, she states she feels better.  She is able to orally hydrate.  She is ambulatory without assistance.  Do not feel she needs further workup at this time.  After history, exam, and medical workup I feel the patient has been appropriately medically screened and is safe for discharge home. Pertinent diagnoses were discussed with the patient. Patient was given return precautions.   Final Clinical Impressions(s) / ED Diagnoses   Final diagnoses:  Vertigo    ED Discharge Orders        Ordered    meclizine (ANTIVERT) 25 MG tablet  3 times daily PRN     04/23/17 0158    ondansetron (ZOFRAN ODT) 4 MG disintegrating tablet  Every 8 hours PRN     04 /15/19 0158  Merryl Hacker, MD 04/23/17 0200

## 2017-04-23 NOTE — ED Notes (Signed)
Pt given d/c instructions as per chart. Rx x 2. Verbalizes understanding. No questions. 

## 2017-04-23 NOTE — ED Notes (Signed)
Pt ambulated in the hallway without difficulty.
